# Patient Record
Sex: Female | Born: 1995 | Race: White | Hispanic: No | Marital: Married | Smoking: Never smoker
Health system: Southern US, Community
[De-identification: ages and names within clinical notes are randomized; demographics above are authoritative.]

---

## 2018-04-25 ENCOUNTER — Emergency Department (INDEPENDENT_AMBULATORY_CARE_PROVIDER_SITE_OTHER): Payer: No Typology Code available for payment source

## 2018-04-25 ENCOUNTER — Emergency Department
Admission: EM | Admit: 2018-04-25 | Discharge: 2018-04-25 | Disposition: A | Discharge status: Home / Self Care | Payer: No Typology Code available for payment source | Source: Home / Self Care | Attending: Family Medicine | Admitting: Family Medicine

## 2018-04-25 ENCOUNTER — Other Ambulatory Visit: Payer: Self-pay

## 2018-04-25 DIAGNOSIS — S29011A Strain of muscle and tendon of front wall of thorax, initial encounter: Secondary | ICD-10-CM

## 2018-04-25 DIAGNOSIS — R0781 Pleurodynia: Secondary | ICD-10-CM

## 2018-04-25 DIAGNOSIS — S29012A Strain of muscle and tendon of back wall of thorax, initial encounter: Secondary | ICD-10-CM

## 2018-04-25 MED ORDER — HYDROCODONE-ACETAMINOPHEN 5-325 MG PO TABS: ORAL_TABLET | ORAL | 0 refills | Status: DC

## 2018-04-25 MED ORDER — CYCLOBENZAPRINE HCL 10 MG PO TABS: 10.0000 mg | ORAL_TABLET | Freq: Every day | ORAL | 0 refills | Status: DC

## 2018-04-25 MED ORDER — ONDANSETRON 4 MG PO TBDP: ORAL_TABLET | ORAL | 0 refills | Status: DC

## 2018-04-25 NOTE — Discharge Instructions (Addendum)
Apply ice pack for 20 to 30 minutes, 3 to 4 times daily  Continue until pain and swelling decrease.  May take Ibuprofen 200mg, 4 tabs every 8 hours with food.  Begin range of motion and stretching exercises as tolerated. °

## 2018-04-25 NOTE — ED Provider Notes (Addendum)
[facility]    CSN: [id number] Arrival date & time: 04/25/18  1728     History   Chief Complaint Chief Complaint  Patient presents with  . Back Pain    trouble sleeping and breathing at times    HPI Tamara Pearson is a 22 y.o. female.   Patient was bathing her 80 pound dog in an outdoor plastic pool two days ago when he suddenly tried to jump out.  While restraining him she felt some vague mild discomfort in her left lateral chest.  Yesterday she awoke with significantly worse pain in her left chest radiating to her left scapula.  She has pain with inspiration, chest movement, and supine on her left side.  The pain has not responded to heat or ibuprofen.  She denies shortness of breath.  The history is provided by the patient.  Chest Pain  Pain location:  L lateral chest Pain quality: aching   Pain radiates to:  Upper back Pain severity:  Moderate Onset quality:  Gradual Duration:  2 days Timing:  Constant Progression:  Worsening Chronicity:  New Context: breathing, lifting, movement, raising an arm and at rest   Relieved by:  Nothing Worsened by:  Coughing, deep breathing, movement and certain positions Ineffective treatments: heat and ibuprofen. Associated symptoms: back pain   Associated symptoms: no abdominal pain, no cough, no diaphoresis, no fever and no shortness of breath     History reviewed. No pertinent past medical history.  There are no active problems to display for this patient.   History reviewed. No pertinent surgical history.  OB History    Gravida  1   Para      Term      Preterm      AB      Living        SAB      TAB      Ectopic      Multiple      Live Births               Home Medications    Prior to Admission medications   Medication Sig Start Date End Date Taking? Authorizing Provider  cyclobenzaprine (FLEXERIL) 10 MG tablet Take 1 tablet (10 mg total) by mouth at bedtime. 04/25/18   Lattie Haw, MD  HYDROcodone-acetaminophen (NORCO/VICODIN) 5-325 MG tablet Take one by mouth at bedtime as needed for pain.  May repeat in 4 to 6 hours if needed. 04/25/18   Lattie Haw, MD    Family History Family History  Problem Relation Age of Onset  . Hypertension Father     Social History Social History   Tobacco Use  . Smoking status: Never Smoker  . Smokeless tobacco: Never Used  Substance Use Topics  . Alcohol use: Yes    Comment: occasional  . Drug use: Never     Allergies   Patient has no known allergies.   Review of Systems Review of Systems  Constitutional: Negative for chills, diaphoresis and fever.  HENT: Negative.   Eyes: Negative.   Respiratory: Positive for chest tightness. Negative for cough, shortness of breath, wheezing and stridor.   Cardiovascular: Positive for chest pain.  Gastrointestinal: Negative for abdominal pain.  Genitourinary: Negative.   Musculoskeletal: Positive for back pain. Negative for neck pain.  Skin: Negative.   Neurological: Negative for light-headedness.     Physical Exam Triage Vital Signs ED Triage Vitals  Enc Vitals Group  BP 04/25/18 1757 133/80     Pulse Rate 04/25/18 1757 64     Resp 04/25/18 1757 18     Temp 04/25/18 1757 98.3 F (36.8 C)     Temp Source 04/25/18 1757 Oral     SpO2 --      Weight 04/25/18 1758 123 lb 6.4 oz (56 kg)     Height 04/25/18 1758 5\' 2"  (1.575 m)     Head Circumference --      Peak Flow --      Pain Score 04/25/18 1758 6     Pain Loc --      Pain Edu? --      Excl. in GC? --    No data found.  Updated Vital Signs BP 133/80 (BP Location: Right Arm)   Pulse 64   Temp 98.3 F (36.8 C) (Oral)   Resp 18   Ht 5\' 2"  (1.575 m)   Wt 56 kg   LMP 12/19/2017 (Approximate) Comment: pt not currently pregnant following up with OB  Breastfeeding? Unknown   BMI 22.57 kg/m   Visual Acuity Right Eye Distance:   Left Eye Distance:   Bilateral Distance:    Right Eye Near:   Left Eye  Near:    Bilateral Near:     Physical Exam  Constitutional: She appears well-developed and well-nourished. No distress.  HENT:  Head: Normocephalic.  Mouth/Throat: Oropharynx is clear and moist.  Eyes: Pupils are equal, round, and reactive to light.  Neck: Normal range of motion.  Cardiovascular: Normal heart sounds.  Pulmonary/Chest: Breath sounds normal.      There is diffuse mild tenderness to palpation left chest as noted on diagram.   There is distinct tenderness over medial and inferior edges of left scapula.  Pain elicited by resisted abduction of left shoulder while palpating left rhomboid muscles.     Abdominal: There is no tenderness.  Musculoskeletal: She exhibits no edema.  Neurological: She is alert.  Skin: Skin is warm and dry.  Nursing note and vitals reviewed.    UC Treatments / Results  Labs (all labs ordered are listed, but only abnormal results are displayed) Labs Reviewed - No data to display  EKG None  Radiology Dg Ribs Unilateral W/chest Left  Result Date: 04/25/2018 CLINICAL DATA:  Left posterolateral rib pain after washing her dog 2 days ago. EXAM: LEFT RIBS AND CHEST - 3+ VIEW COMPARISON:  None. FINDINGS: No fracture or other bone lesions are seen involving the ribs. There is no evidence of pneumothorax or pleural effusion. Both lungs are clear. Heart size and mediastinal contours are within normal limits. IMPRESSION: Negative. Electronically Signed   By: Sherian Rein M.D.   On: 04/25/2018 18:50    Procedures Procedures (including critical care time)  Medications Ordered in UC Medications - No data to display  Initial Impression / Assessment and Plan / UC Course  I have reviewed the triage vital signs and the nursing notes.  Pertinent labs & imaging results that were available during my care of the patient were reviewed by me and considered in my medical decision making (see chart for details).    Begin Flexeril 10mg  and Lortab (Rx #10,  no refill) at bedtime prn. Controlled Substance Prescriptions I have consulted the [place] Controlled Substances Registry for this patient, and feel the risk/benefit ratio today is favorable for proceeding with this prescription for a controlled substance.   Followup with Dr. Rodney Langton or Dr. Clementeen Graham ([facility]) if not improving about two weeks.    Final Clinical Impressions(s) / UC Diagnoses   Final diagnoses:  Rhomboid muscle strain, initial encounter  Intercostal muscle strain, initial encounter     Discharge Instructions     Apply ice pack for 20 to 30 minutes, 3 to 4 times daily  Continue until pain and swelling decrease.  May take Ibuprofen 200mg , 4 tabs every 8 hours with food.   Begin range of motion and stretching exercises as tolerated.    ED Prescriptions    Medication Sig Dispense Auth. Provider   cyclobenzaprine (FLEXERIL) 10 MG tablet Take 1 tablet (10 mg total) by mouth at bedtime. 10 tablet Lattie Haw, MD   HYDROcodone-acetaminophen (NORCO/VICODIN) 5-325 MG tablet Take one by mouth at bedtime as needed for pain.  May repeat in 4 to 6 hours if needed. 10 tablet Lattie Haw, MD         Lattie Haw, MD 04/25/18 1921  Addendum:  Patient reports that she has had nausea in the past when taking hydrocodone and requests Rx for Zofran ODT. Rx written for Zofran ODT 4mg , #12   Lattie Haw, MD 04/25/18 1926

## 2018-04-25 NOTE — ED Triage Notes (Signed)
Pt presents today with c/o back left sided shooting pain 6/0-10 that radiates to Left ribcage and upright. Pt explained that she was bathing her 80 lbs lab in her backyard in a plastic pool when her dog attempted to get out and she tried to catch him. The incident happened on Sunday 11/3 since then, she has been having trouble sleeping and pain during inspiration at times. She has taken ibuprofen for pain with mild relief.

## 2018-04-25 NOTE — ED Notes (Signed)
PT made aware she cannot drive or operate machinery while taking vicodin or flexeril for pain. PT instructed to not take additional tylenol with vicodin. PT instructed to use either one of the pain meds tonight, not both.

## 2019-05-09 ENCOUNTER — Telehealth: Payer: No Typology Code available for payment source | Admitting: Family

## 2019-05-09 DIAGNOSIS — B002 Herpesviral gingivostomatitis and pharyngotonsillitis: Secondary | ICD-10-CM

## 2019-05-09 MED ORDER — ACYCLOVIR 800 MG PO TABS: 800.0000 mg | ORAL_TABLET | Freq: Two times a day (BID) | ORAL | 1 refills | Status: DC

## 2019-05-09 NOTE — Progress Notes (Signed)
We are sorry that you are not feeling well.  Here is how we plan to help!  Based on what you have shared with me it does look like you have a viral infection.    Most cold sores or fever blisters are small fluid filled blisters around the mouth caused by herpes simplex virus.  The most common strain of the virus causing cold sores is herpes simplex virus 1.  It can be spread by skin contact, sharing eating utensils, or even sharing towels.  Cold sores are contagious to other people until dry. (Approximately 5-7 days).  Wash your hands. You can spread the virus to your eyes through handling your contact lenses after touching the lesions.  Most people experience pain at the sight or tingling sensations in their lips that may begin before the ulcers erupt.  Herpes simplex is treatable but not curable.  It may lie dormant for a long time and then reappear due to stress or prolonged sun exposure.  Many patients have success in treating their cold sores with an over the counter topical called Abreva.  You may apply the cream up to 5 times daily (maximum 10 days) until healing occurs.  If you would like to use an oral antiviral medication to speed the healing of your cold sore, I have sent a prescription to your local pharmacy Acyclovir 800 mg take one by mouth twice a day for 7 days.   HOME CARE:   Wash your hands frequently.  Do not pick at or rub the sore.  Don't open the blisters.  Avoid kissing other people during this time.  Avoid sharing drinking glasses, eating utensils, or razors.  Do not handle contact lenses unless you have thoroughly washed your hands with soap and warm water!  Avoid oral sex during this time.  Herpes from sores on your mouth can spread to your partner's genital area.  Avoid contact with anyone who has eczema or a weakened immune system.  Cold sores are often triggered by exposure to intense sunlight, use a lip balm containing a sunscreen (SPF 30 or  higher).  GET HELP RIGHT AWAY IF:   Blisters look infected.  Blisters occur near or in the eye.  Symptoms last longer than 10 days.  Your symptoms become worse.  MAKE SURE YOU:   Understand these instructions.  Will watch your condition.  Will get help right away if you are not doing well or get worse.    Your e-visit answers were reviewed by a board certified advanced clinical practitioner to complete your personal care plan.  Depending upon the condition, your plan could have  Included both over the counter or prescription medications.    Please review your pharmacy choice.  Be sure that the pharmacy you have chosen is open so that you can pick up your prescription now.  If there is a problem you can message your provider in MyChart to have the prescription routed to another pharmacy.    Your safety is important to us.  If you have drug allergies check our prescription carefully.  For the next 24 hours you can use MyChart to ask questions about today's visit, request a non-urgent call back, or ask for a work or school excuse from your e-visit provider.  You will get an email in the next two days asking about your experience.  I hope that your e-visit has been valuable and will speed your recovery.   Approximately 5 minutes was spent documenting and   reviewing patient's chart.    

## 2019-11-28 ENCOUNTER — Telehealth: Payer: No Typology Code available for payment source | Admitting: Family

## 2019-11-28 DIAGNOSIS — B009 Herpesviral infection, unspecified: Secondary | ICD-10-CM

## 2019-11-28 DIAGNOSIS — B002 Herpesviral gingivostomatitis and pharyngotonsillitis: Secondary | ICD-10-CM

## 2019-11-28 MED ORDER — ACYCLOVIR 800 MG PO TABS: 800.0000 mg | ORAL_TABLET | Freq: Two times a day (BID) | ORAL | 0 refills | Status: DC

## 2019-11-28 NOTE — Progress Notes (Signed)
We are sorry that you are not feeling well.  Here is how we plan to help!  Based on what you have shared with me it does look like you have a viral infection.    Most cold sores or fever blisters are small fluid filled blisters around the mouth caused by herpes simplex virus.  The most common strain of the virus causing cold sores is herpes simplex virus 1.  It can be spread by skin contact, sharing eating utensils, or even sharing towels.  Cold sores are contagious to other people until dry. (Approximately 5-7 days).  Wash your hands. You can spread the virus to your eyes through handling your contact lenses after touching the lesions.  Most people experience pain at the sight or tingling sensations in their lips that may begin before the ulcers erupt.  Herpes simplex is treatable but not curable.  It may lie dormant for a long time and then reappear due to stress or prolonged sun exposure.  Many patients have success in treating their cold sores with an over the counter topical called Abreva.  You may apply the cream up to 5 times daily (maximum 10 days) until healing occurs.  If you would like to use an oral antiviral medication to speed the healing of your cold sore, I have sent a prescription to your local pharmacy Acyclovir 800 mg take one by mouth twice a day for 7 days.   HOME CARE:   Wash your hands frequently.  Do not pick at or rub the sore.  Don't open the blisters.  Avoid kissing other people during this time.  Avoid sharing drinking glasses, eating utensils, or razors.  Do not handle contact lenses unless you have thoroughly washed your hands with soap and warm water!  Avoid oral sex during this time.  Herpes from sores on your mouth can spread to your partner's genital area.  Avoid contact with anyone who has eczema or a weakened immune system.  Cold sores are often triggered by exposure to intense sunlight, use a lip balm containing a sunscreen (SPF 30 or  higher).  GET HELP RIGHT AWAY IF:   Blisters look infected.  Blisters occur near or in the eye.  Symptoms last longer than 10 days.  Your symptoms become worse.  MAKE SURE YOU:   Understand these instructions.  Will watch your condition.  Will get help right away if you are not doing well or get worse.    Your e-visit answers were reviewed by a board certified advanced clinical practitioner to complete your personal care plan.  Depending upon the condition, your plan could have  Included both over the counter or prescription medications.    Please review your pharmacy choice.  Be sure that the pharmacy you have chosen is open so that you can pick up your prescription now.  If there is a problem you can message your provider in MyChart to have the prescription routed to another pharmacy.    Your safety is important to [place].  If you have drug allergies check our prescription carefully.  For the next 24 hours you can use MyChart to ask questions about today's visit, request a non-urgent call back, or ask for a work or school excuse from your e-visit provider.  You will get an email in the next two days asking about your experience.  I hope that your e-visit has been valuable and will speed your recovery.   Approximately 5 minutes was spent documenting and  reviewing patient's chart.

## 2019-12-25 ENCOUNTER — Other Ambulatory Visit: Payer: Self-pay

## 2019-12-25 DIAGNOSIS — B002 Herpesviral gingivostomatitis and pharyngotonsillitis: Secondary | ICD-10-CM

## 2019-12-25 MED ORDER — ACYCLOVIR 800 MG PO TABS: 800.0000 mg | ORAL_TABLET | Freq: Two times a day (BID) | ORAL | 0 refills | Status: DC

## 2020-02-18 ENCOUNTER — Other Ambulatory Visit: Payer: Self-pay

## 2020-02-18 ENCOUNTER — Telehealth: Payer: Self-pay | Admitting: Nurse Practitioner

## 2020-02-18 DIAGNOSIS — B002 Herpesviral gingivostomatitis and pharyngotonsillitis: Secondary | ICD-10-CM

## 2020-02-18 MED ORDER — VALACYCLOVIR HCL 1 G PO TABS: ORAL_TABLET | ORAL | 0 refills | Status: DC

## 2020-02-18 NOTE — Progress Notes (Signed)
We are sorry that you are not feeling well.  Here is how we plan to help!  Based on what you have shared with me it does look like you have a viral infection.    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 Most cold sores or fever blisters are small fluid filled blisters around the mouth caused by herpes simplex virus.  The most common strain of the virus causing cold sores is herpes simplex virus 1.  It can be spread by skin contact, sharing eating utensils, or even sharing towels.  Cold sores are contagious to other people until dry. (Approximately 5-7 days).  Wash your hands. You can spread the virus to your eyes through handling your contact lenses after touching the lesions.  Most people experience pain at the sight or tingling sensations in their lips that may begin before the ulcers erupt.  Herpes simplex is treatable but not curable.  It may lie dormant for a long time and then reappear due to stress or prolonged sun exposure.  Many patients have success in treating their cold sores with an over the counter topical called Abreva.  You may apply the cream up to 5 times daily (maximum 10 days) until healing occurs.  If you would like to use an oral antiviral medication to speed the healing of your cold sore, I have sent a prescription to your local pharmacy Valacyclovir 2 gm take one by mouth twice a day for 1 day    HOME CARE:   Wash your hands frequently.  Do not pick at or rub the sore.  Don't open the blisters.  Avoid kissing other people during this time.  Avoid sharing drinking glasses, eating utensils, or razors.  Do not handle contact lenses unless you have thoroughly washed your hands with soap and warm water!  Avoid oral sex during this time.  Herpes from sores on your mouth can spread to your partner's genital area.  Avoid contact with anyone who has eczema or a weakened immune system.  Cold  sores are often triggered by exposure to intense sunlight, use a lip balm containing a sunscreen (SPF 30 or higher).  GET HELP RIGHT AWAY IF:   Blisters look infected.  Blisters occur near or in the eye.  Symptoms last longer than 10 days.  Your symptoms become worse.  MAKE SURE YOU:   Understand these instructions.  Will watch your condition.  Will get help right away if you are not doing well or get worse.    Your e-visit answers were reviewed by a board certified advanced clinical practitioner to complete your personal care plan.  Depending upon the condition, your plan could have  Included both over the counter or prescription medications.    Please review your pharmacy choice.  Be sure that the pharmacy you have chosen is open so that you can pick up your prescription now.  If there is a problem you can message your provider in MyChart to have the prescription routed to another pharmacy.    Your safety is important to [place].  If you have drug allergies check our prescription carefully.  For the next 24 hours you can use MyChart to ask questions about today's visit, request a non-urgent call back, or ask for a work or school excuse from your e-visit provider.  You will get an email in the next two days asking about your experience.  I hope that your e-visit has been valuable and will speed your recovery.  There are other unrelated non-urgent complaints, but due to the busy schedule and the amount of time I've already spent with her, time does not permit me to address these routine issues at today's visit. I've requested another appointment to review these additional issues.  5-10 minutes spent reviewing and documenting in chart.

## 2020-05-14 ENCOUNTER — Other Ambulatory Visit: Payer: Self-pay | Admitting: Obstetrics & Gynecology

## 2020-05-14 DIAGNOSIS — R319 Hematuria, unspecified: Secondary | ICD-10-CM

## 2020-06-03 ENCOUNTER — Ambulatory Visit
Admission: RE | Admit: 2020-06-03 | Discharge: 2020-06-03 | Disposition: A | Discharge status: Home / Self Care | Payer: Self-pay | Source: Ambulatory Visit | Attending: Obstetrics & Gynecology | Admitting: Obstetrics & Gynecology

## 2020-06-03 DIAGNOSIS — R319 Hematuria, unspecified: Secondary | ICD-10-CM

## 2020-10-15 LAB — OB RESULTS CONSOLE GBS: GBS: NEGATIVE

## 2020-11-07 ENCOUNTER — Inpatient Hospital Stay (HOSPITAL_COMMUNITY): Admit: 2020-11-07 | Payer: Self-pay

## 2020-11-13 ENCOUNTER — Other Ambulatory Visit: Payer: Self-pay | Admitting: Obstetrics & Gynecology

## 2020-11-14 ENCOUNTER — Other Ambulatory Visit: Payer: Self-pay

## 2020-11-15 ENCOUNTER — Inpatient Hospital Stay (HOSPITAL_COMMUNITY): Payer: Managed Care, Other (non HMO) | Admitting: Anesthesiology

## 2020-11-15 ENCOUNTER — Encounter (HOSPITAL_COMMUNITY): Payer: Self-pay | Admitting: Obstetrics & Gynecology

## 2020-11-15 ENCOUNTER — Inpatient Hospital Stay (HOSPITAL_COMMUNITY): Payer: Managed Care, Other (non HMO)

## 2020-11-15 ENCOUNTER — Inpatient Hospital Stay (HOSPITAL_COMMUNITY)
Admission: AD | Admit: 2020-11-15 | Discharge: 2020-11-18 | DRG: 786 | Disposition: A | Discharge status: Home / Self Care | Payer: Managed Care, Other (non HMO) | Attending: Obstetrics & Gynecology | Admitting: Obstetrics & Gynecology

## 2020-11-15 DIAGNOSIS — O411230 Chorioamnionitis, third trimester, not applicable or unspecified: Secondary | ICD-10-CM | POA: Diagnosis present

## 2020-11-15 DIAGNOSIS — O480 Post-term pregnancy: Principal | ICD-10-CM | POA: Diagnosis present

## 2020-11-15 DIAGNOSIS — D62 Acute posthemorrhagic anemia: Secondary | ICD-10-CM | POA: Diagnosis not present

## 2020-11-15 DIAGNOSIS — O9912 Other diseases of the blood and blood-forming organs and certain disorders involving the immune mechanism complicating childbirth: Secondary | ICD-10-CM | POA: Diagnosis present

## 2020-11-15 DIAGNOSIS — Z20822 Contact with and (suspected) exposure to covid-19: Secondary | ICD-10-CM | POA: Diagnosis present

## 2020-11-15 DIAGNOSIS — O9081 Anemia of the puerperium: Secondary | ICD-10-CM | POA: Diagnosis not present

## 2020-11-15 DIAGNOSIS — D6959 Other secondary thrombocytopenia: Secondary | ICD-10-CM | POA: Diagnosis present

## 2020-11-15 DIAGNOSIS — O358XX0 Maternal care for other (suspected) fetal abnormality and damage, not applicable or unspecified: Secondary | ICD-10-CM | POA: Diagnosis present

## 2020-11-15 DIAGNOSIS — O411290 Chorioamnionitis, unspecified trimester, not applicable or unspecified: Secondary | ICD-10-CM | POA: Diagnosis not present

## 2020-11-15 DIAGNOSIS — D696 Thrombocytopenia, unspecified: Secondary | ICD-10-CM | POA: Diagnosis present

## 2020-11-15 DIAGNOSIS — O339 Maternal care for disproportion, unspecified: Secondary | ICD-10-CM | POA: Diagnosis present

## 2020-11-15 DIAGNOSIS — O26893 Other specified pregnancy related conditions, third trimester: Secondary | ICD-10-CM | POA: Diagnosis present

## 2020-11-15 DIAGNOSIS — Z3490 Encounter for supervision of normal pregnancy, unspecified, unspecified trimester: Secondary | ICD-10-CM | POA: Diagnosis present

## 2020-11-15 DIAGNOSIS — O99119 Other diseases of the blood and blood-forming organs and certain disorders involving the immune mechanism complicating pregnancy, unspecified trimester: Secondary | ICD-10-CM | POA: Diagnosis present

## 2020-11-15 DIAGNOSIS — Z3A41 41 weeks gestation of pregnancy: Secondary | ICD-10-CM

## 2020-11-15 DIAGNOSIS — O99892 Other specified diseases and conditions complicating childbirth: Secondary | ICD-10-CM | POA: Diagnosis present

## 2020-11-15 DIAGNOSIS — R319 Hematuria, unspecified: Secondary | ICD-10-CM | POA: Diagnosis present

## 2020-11-15 LAB — CBC
HCT: 37.5 % (ref 36.0–46.0)
Hemoglobin: 12.9 g/dL (ref 12.0–15.0)
MCH: 31.9 pg (ref 26.0–34.0)
MCHC: 34.4 g/dL (ref 30.0–36.0)
MCV: 92.8 fL (ref 80.0–100.0)
Platelets: 127 10*3/uL — ABNORMAL LOW (ref 150–400)
RBC: 4.04 MIL/uL (ref 3.87–5.11)
RDW: 13.3 % (ref 11.5–15.5)
WBC: 11.4 10*3/uL — ABNORMAL HIGH (ref 4.0–10.5)
nRBC: 0 % (ref 0.0–0.2)

## 2020-11-15 LAB — RPR: RPR Ser Ql: NONREACTIVE

## 2020-11-15 LAB — RESP PANEL BY RT-PCR (FLU A&B, COVID) ARPGX2
Influenza A by PCR: NEGATIVE
Influenza B by PCR: NEGATIVE
SARS Coronavirus 2 by RT PCR: NEGATIVE

## 2020-11-15 LAB — TYPE AND SCREEN
ABO/RH(D): O POS
Antibody Screen: NEGATIVE

## 2020-11-15 MED ORDER — OXYTOCIN-SODIUM CHLORIDE 30-0.9 UT/500ML-% IV SOLN
2.5000 [IU]/h | INTRAVENOUS | Status: DC
  Filled 2020-11-15: qty 500

## 2020-11-15 MED ORDER — EPHEDRINE 5 MG/ML INJ: 10.0000 mg | INTRAVENOUS | Status: DC | PRN

## 2020-11-15 MED ORDER — MISOPROSTOL 25 MCG QUARTER TABLET
25.0000 ug | ORAL_TABLET | ORAL | Status: DC | PRN
  Administered 2020-11-15 (×2): 25 ug via VAGINAL
  Filled 2020-11-15 (×2): qty 1

## 2020-11-15 MED ORDER — FENTANYL-BUPIVACAINE-NACL 0.5-0.125-0.9 MG/250ML-% EP SOLN
12.0000 mL/h | EPIDURAL | Status: DC | PRN
  Administered 2020-11-15 – 2020-11-16 (×2): 12 mL/h via EPIDURAL
  Filled 2020-11-15 (×2): qty 250

## 2020-11-15 MED ORDER — LACTATED RINGERS IV SOLN
INTRAVENOUS | Status: DC
  Administered 2020-11-15: 125 mL/h via INTRAVENOUS

## 2020-11-15 MED ORDER — OXYTOCIN-SODIUM CHLORIDE 30-0.9 UT/500ML-% IV SOLN
1.0000 m[IU]/min | INTRAVENOUS | Status: DC
  Administered 2020-11-15: 1 m[IU]/min via INTRAVENOUS

## 2020-11-15 MED ORDER — OXYTOCIN BOLUS FROM INFUSION: 333.0000 mL | Freq: Once | INTRAVENOUS | Status: DC

## 2020-11-15 MED ORDER — METOCLOPRAMIDE HCL 5 MG/ML IJ SOLN
10.0000 mg | Freq: Four times a day (QID) | INTRAMUSCULAR | Status: DC
  Administered 2020-11-15: 10 mg via INTRAVENOUS
  Administered 2020-11-16 (×2): 5 mg via INTRAVENOUS
  Filled 2020-11-15: qty 2

## 2020-11-15 MED ORDER — DIPHENHYDRAMINE HCL 50 MG/ML IJ SOLN
12.5000 mg | INTRAMUSCULAR | Status: DC | PRN
Start: 2020-11-15 — End: 2020-11-16

## 2020-11-15 MED ORDER — LACTATED RINGERS IV SOLN
500.0000 mL | Freq: Once | INTRAVENOUS | Status: AC
  Administered 2020-11-15: 500 mL via INTRAVENOUS

## 2020-11-15 MED ORDER — TERBUTALINE SULFATE 1 MG/ML IJ SOLN
0.2500 mg | Freq: Once | INTRAMUSCULAR | Status: DC | PRN
Start: 2020-11-15 — End: 2020-11-16

## 2020-11-15 MED ORDER — ONDANSETRON HCL 4 MG/2ML IJ SOLN
4.0000 mg | Freq: Four times a day (QID) | INTRAMUSCULAR | Status: DC | PRN
  Administered 2020-11-15 – 2020-11-16 (×3): 4 mg via INTRAVENOUS
  Filled 2020-11-15 (×2): qty 2

## 2020-11-15 MED ORDER — PHENYLEPHRINE 40 MCG/ML (10ML) SYRINGE FOR IV PUSH (FOR BLOOD PRESSURE SUPPORT): 80.0000 ug | PREFILLED_SYRINGE | INTRAVENOUS | Status: DC | PRN

## 2020-11-15 MED ORDER — LIDOCAINE HCL (PF) 1 % IJ SOLN
30.0000 mL | INTRAMUSCULAR | Status: DC | PRN
Start: 2020-11-15 — End: 2020-11-16

## 2020-11-15 MED ORDER — LACTATED RINGERS IV SOLN
500.0000 mL | INTRAVENOUS | Status: DC | PRN
  Administered 2020-11-15: 500 mL via INTRAVENOUS

## 2020-11-15 MED ORDER — ACETAMINOPHEN 325 MG PO TABS: 650.0000 mg | ORAL_TABLET | ORAL | Status: DC | PRN

## 2020-11-15 MED ORDER — SOD CITRATE-CITRIC ACID 500-334 MG/5ML PO SOLN
30.0000 mL | ORAL | Status: DC | PRN
  Administered 2020-11-16: 30 mL via ORAL
  Filled 2020-11-15: qty 15

## 2020-11-15 MED ORDER — LIDOCAINE-EPINEPHRINE (PF) 2 %-1:200000 IJ SOLN
INTRAMUSCULAR | Status: DC | PRN
  Administered 2020-11-15: 4 mL via EPIDURAL

## 2020-11-15 MED ORDER — FENTANYL CITRATE (PF) 100 MCG/2ML IJ SOLN
50.0000 ug | INTRAMUSCULAR | Status: DC | PRN
Start: 2020-11-15 — End: 2020-11-16
  Administered 2020-11-15 (×4): 100 ug via INTRAVENOUS
  Filled 2020-11-15 (×4): qty 2

## 2020-11-15 NOTE — Anesthesia Preprocedure Evaluation (Signed)
Anesthesia Evaluation  Patient identified by MRN, date of birth, ID band Patient awake    Reviewed: Allergy & Precautions, NPO status , Patient's Chart, lab work & pertinent test results  Airway Mallampati: II  TM Distance: >3 FB Neck ROM: Full    Dental no notable dental hx.    Pulmonary neg pulmonary ROS,    Pulmonary exam normal breath sounds clear to auscultation       Cardiovascular negative cardio ROS Normal cardiovascular exam Rhythm:Regular Rate:Normal     Neuro/Psych negative neurological ROS  negative psych ROS   GI/Hepatic negative GI ROS, Neg liver ROS,   Endo/Other  negative endocrine ROS  Renal/GU negative Renal ROS  negative genitourinary   Musculoskeletal negative musculoskeletal ROS (+)   Abdominal   Peds  Hematology plt 127   Anesthesia Other Findings   Reproductive/Obstetrics (+) Pregnancy                             Anesthesia Physical Anesthesia Plan  ASA: II  Anesthesia Plan: Epidural   Post-op Pain Management:    Induction:   PONV Risk Score and Plan: Treatment may vary due to age or medical condition  Airway Management Planned: Natural Airway  Additional Equipment:   Intra-op Plan:   Post-operative Plan:   Informed Consent: I have reviewed the patients History and Physical, chart, labs and discussed the procedure including the risks, benefits and alternatives for the proposed anesthesia with the patient or authorized representative who has indicated his/her understanding and acceptance.       Plan Discussed with: Anesthesiologist  Anesthesia Plan Comments: (Patient identified. Risks, benefits, options discussed with patient including but not limited to bleeding, infection, nerve damage, paralysis, failed block, incomplete pain control, headache, blood pressure changes, nausea, vomiting, reactions to medication, itching, and post partum back pain.  Confirmed with bedside nurse the patient's most recent platelet count. Confirmed with the patient that they are not taking any anticoagulation, have any bleeding history or any family history of bleeding disorders. Patient expressed understanding and wishes to proceed. All questions were answered. )        Anesthesia Quick Evaluation

## 2020-11-15 NOTE — Progress Notes (Signed)
Spoke with RN earlier and reviewed FHT,she was just post epidural FHT noted few variable decels and some with late position but mod variability -  S/p 2 Cytotec doses, now pitocin was at 3 mu rate, adsvised RN to bring down to 1 mu rate and reassess if better if UCs spaced FHT now cat I and UCs q 2 min.  No change in Cx per RN.continue to follow

## 2020-11-15 NOTE — Progress Notes (Signed)
Tamara Pearson is a 25 y.o. G1P0 at [redacted]w[redacted]d by ultrasound admitted for induction of labor due to Post dates, 41.1 wks.  Subjective: Vomiting, Zofran x 3 doses didn't help, Reglan not helping Pain controlled, getting tired   Objective: BP 131/67   Pulse 84   Temp 98.4 F (36.9 C) (Oral)   Resp 18   Ht 5\' 2"  (1.575 m)   Wt 74 kg   LMP 02/01/2020 (Exact Date)   SpO2 99%   BMI 29.85 kg/m  I/O last 3 completed shifts: In: 187.5 [I.V.:187.5] Out: 100 [Urine:100] No intake/output data recorded.  FHT:  FHR: 150 bpm, variability: moderate,  accelerations:  Present,  decelerations:  Present mild decels in 110s with UCs and quick return to baseline   Cat I  UC:   regular, every 3 minutes SVE:   Dilation: 3 Effacement (%): 100 Station: -2 Exam by:: Waylynn Benefiel, MD Head well applied to cervix, very small caput noted.  Urine clear, no hematuria  LE swelling since given several fluid boluses by AM RN. Advised to check with MD now before boluses    Assessment / Plan: IOL, protracted latent phase. S/p 2 Cytotecs and we started very low dose pitocin due to UCs and is not back down to 1 mu rate  MOD working towards SVD, continue to assess, hoping for her to enter active labor soon Right lateral with peanut ball   [phone] 11/15/2020, 8:31 PM

## 2020-11-15 NOTE — Anesthesia Procedure Notes (Signed)
Epidural Patient location during procedure: OB Start time: 11/15/2020 12:10 PM End time: 11/15/2020 12:20 PM  Staffing Anesthesiologist: Elmer Picker, MD Performed: anesthesiologist   Preanesthetic Checklist Completed: patient identified, IV checked, risks and benefits discussed, monitors and equipment checked, pre-op evaluation and timeout performed  Epidural Patient position: sitting Prep: DuraPrep and site prepped and draped Patient monitoring: continuous pulse ox, blood pressure, heart rate and cardiac monitor Approach: midline Location: L3-L4 Injection technique: LOR air  Needle:  Needle type: Tuohy  Needle gauge: 17 G Needle length: 9 cm Needle insertion depth: 4 cm Catheter type: closed end flexible Catheter size: 19 Gauge Catheter at skin depth: 10 cm Test dose: negative  Assessment Sensory level: T8 Events: blood not aspirated, injection not painful, no injection resistance, no paresthesia and negative IV test  Additional Notes Patient identified. Risks/Benefits/Options discussed with patient including but not limited to bleeding, infection, nerve damage, paralysis, failed block, incomplete pain control, headache, blood pressure changes, nausea, vomiting, reactions to medication both or allergic, itching and postpartum back pain. Confirmed with bedside nurse the patient's most recent platelet count. Confirmed with patient that they are not currently taking any anticoagulation, have any bleeding history or any family history of bleeding disorders. Patient expressed understanding and wished to proceed. All questions were answered. Sterile technique was used throughout the entire procedure. Please see nursing notes for vital signs. Test dose was given through epidural catheter and negative prior to continuing to dose epidural or start infusion. Warning signs of high block given to the patient including shortness of breath, tingling/numbness in hands, complete motor block,  or any concerning symptoms with instructions to call for help. Patient was given instructions on fall risk and not to get out of bed. All questions and concerns addressed with instructions to call with any issues or inadequate analgesia.  Reason for block:procedure for pain

## 2020-11-15 NOTE — H&P (Signed)
Tamara Pearson is a 25 y.o. female G1 presenting for IOL at 41.1 wks. Uncomplicated pregnancy but 44 lb weight gain. Bilateral choroid plexus cysts, no other markers. Growth at 34.5 wks- 5'13" 58% AC 58%,Nll AFI.   OB History    Gravida  1   Para      Term      Preterm      AB      Living        SAB      IAB      Ectopic      Multiple      Live Births             History reviewed. No pertinent past medical history. History reviewed. No pertinent surgical history. Family History: family history includes Hypertension in her father. Social History:  reports that she has never smoked. She has never used smokeless tobacco. She reports previous alcohol use. She reports that she does not use drugs.     Maternal Diabetes: No Genetic Screening: declined Down's screen.AFP1 neg  Maternal Ultrasounds/Referrals: Isolated choroid plexus cyst bilateral, no other markers  Fetal Ultrasounds or other Referrals:  None Maternal Substance Abuse:  No Significant Maternal Medications:  None Significant Maternal Lab Results:  Group B Strep negative Other Comments:  None  Review of Systems History Dilation: 1 Effacement (%): 100 Station: -2 Exam by:: Dr. [name] Blood pressure 126/81, pulse 98, temperature 98.3 F (36.8 C), temperature source Oral, resp. rate 16, height 5\' 2"  (1.575 m), weight 74 kg, last menstrual period 02/01/2020, SpO2 100 %, unknown if currently breastfeeding. Exam Physical Exam  A&O x 3, no acute distress. Pleasant HEENT neg, no thyromegaly Lungs CTA bilat CV RRR, S1S2 normal Abdo soft, non tender, non acute Extr no edema/ tenderness Pelvic 1/100%/-2 Vx Borderline pelvis FHT 140s mod variability, + accels no decels, cat I Toco q 3 min  Prenatal labs: ABO, Rh: --/--/O POS (05/28 0033) Antibody: NEG (05/28 0033) Rubella:  Immune  RPR:   NR HBsAg:   Neg HIV:   Neg GBS: Negative/-- (04/27 0000)  AFP1 neg Glucola nl  Assessment/Plan: 25 yo G1 at  41.1 wks. Here for IOL. S/p Cytotec x 2 and contracting well,add low dose pitocin if spaces out. FHT cat I EFW 8 lbs. Borderline pelvis. Assess progress closely in active labor  Pt has already taken 3 doses of Fentanyl since admission last night, plan early epidural when ready   34 11/15/2020, 10:47 AM

## 2020-11-16 ENCOUNTER — Encounter (HOSPITAL_COMMUNITY): Payer: Self-pay | Admitting: Obstetrics & Gynecology

## 2020-11-16 ENCOUNTER — Encounter (HOSPITAL_COMMUNITY)
Admission: AD | Disposition: A | Discharge status: Home / Self Care | Payer: Self-pay | Source: Home / Self Care | Attending: Obstetrics & Gynecology

## 2020-11-16 DIAGNOSIS — O99892 Other specified diseases and conditions complicating childbirth: Secondary | ICD-10-CM | POA: Diagnosis present

## 2020-11-16 SURGERY — Surgical Case
Anesthesia: Epidural

## 2020-11-16 MED ORDER — NALBUPHINE HCL 10 MG/ML IJ SOLN: 5.0000 mg | Freq: Once | INTRAMUSCULAR | Status: DC | PRN

## 2020-11-16 MED ORDER — SODIUM CHLORIDE 0.9 % IV SOLN
2.0000 g | Freq: Four times a day (QID) | INTRAVENOUS | Status: DC
  Administered 2020-11-16: 2 g via INTRAVENOUS
  Filled 2020-11-16 (×5): qty 2

## 2020-11-16 MED ORDER — DIPHENHYDRAMINE HCL 50 MG/ML IJ SOLN: 12.5000 mg | INTRAMUSCULAR | Status: DC | PRN

## 2020-11-16 MED ORDER — ONDANSETRON HCL 4 MG/2ML IJ SOLN: 4.0000 mg | Freq: Once | INTRAMUSCULAR | Status: DC | PRN

## 2020-11-16 MED ORDER — MEPERIDINE HCL 25 MG/ML IJ SOLN: 6.2500 mg | INTRAMUSCULAR | Status: DC | PRN

## 2020-11-16 MED ORDER — KETOROLAC TROMETHAMINE 30 MG/ML IJ SOLN
INTRAMUSCULAR | Status: AC
  Filled 2020-11-16: qty 1

## 2020-11-16 MED ORDER — CEFOXITIN SODIUM 2 G IV SOLR
2.0000 g | Freq: Four times a day (QID) | INTRAVENOUS | Status: AC
  Administered 2020-11-16 – 2020-11-17 (×4): 2 g via INTRAVENOUS
  Filled 2020-11-16 (×5): qty 2

## 2020-11-16 MED ORDER — SODIUM CHLORIDE 0.9 % IV SOLN
INTRAVENOUS | Status: AC
  Filled 2020-11-16: qty 500

## 2020-11-16 MED ORDER — OXYTOCIN-SODIUM CHLORIDE 30-0.9 UT/500ML-% IV SOLN
INTRAVENOUS | Status: AC
  Filled 2020-11-16: qty 500

## 2020-11-16 MED ORDER — NALBUPHINE HCL 10 MG/ML IJ SOLN: 5.0000 mg | INTRAMUSCULAR | Status: DC | PRN

## 2020-11-16 MED ORDER — METOCLOPRAMIDE HCL 5 MG/ML IJ SOLN
INTRAMUSCULAR | Status: AC
  Filled 2020-11-16: qty 2

## 2020-11-16 MED ORDER — SIMETHICONE 80 MG PO CHEW: 80.0000 mg | CHEWABLE_TABLET | ORAL | Status: DC | PRN

## 2020-11-16 MED ORDER — METHYLERGONOVINE MALEATE 0.2 MG/ML IJ SOLN
INTRAMUSCULAR | Status: AC
  Filled 2020-11-16: qty 1

## 2020-11-16 MED ORDER — COCONUT OIL OIL: 1.0000 "application " | TOPICAL_OIL | Status: DC | PRN

## 2020-11-16 MED ORDER — ACETAMINOPHEN 325 MG PO TABS: 325.0000 mg | ORAL_TABLET | ORAL | Status: DC | PRN

## 2020-11-16 MED ORDER — ACETAMINOPHEN 160 MG/5ML PO SOLN: 325.0000 mg | ORAL | Status: DC | PRN

## 2020-11-16 MED ORDER — NALOXONE HCL 0.4 MG/ML IJ SOLN: 0.4000 mg | INTRAMUSCULAR | Status: DC | PRN

## 2020-11-16 MED ORDER — KETOROLAC TROMETHAMINE 30 MG/ML IJ SOLN: 30.0000 mg | Freq: Four times a day (QID) | INTRAMUSCULAR | Status: DC | PRN

## 2020-11-16 MED ORDER — SODIUM CHLORIDE 0.9% FLUSH: 3.0000 mL | INTRAVENOUS | Status: DC | PRN

## 2020-11-16 MED ORDER — FENTANYL CITRATE (PF) 100 MCG/2ML IJ SOLN: 25.0000 ug | INTRAMUSCULAR | Status: DC | PRN

## 2020-11-16 MED ORDER — MORPHINE SULFATE (PF) 0.5 MG/ML IJ SOLN
INTRAMUSCULAR | Status: DC | PRN
  Administered 2020-11-16: 3 mg via EPIDURAL

## 2020-11-16 MED ORDER — ONDANSETRON HCL 4 MG/2ML IJ SOLN
4.0000 mg | Freq: Three times a day (TID) | INTRAMUSCULAR | Status: DC | PRN
Start: 2020-11-16 — End: 2020-11-18

## 2020-11-16 MED ORDER — OXYTOCIN-SODIUM CHLORIDE 30-0.9 UT/500ML-% IV SOLN
2.5000 [IU]/h | INTRAVENOUS | Status: AC
  Administered 2020-11-16: 2.5 [IU]/h via INTRAVENOUS
  Filled 2020-11-16: qty 500

## 2020-11-16 MED ORDER — DEXAMETHASONE SODIUM PHOSPHATE 4 MG/ML IJ SOLN
INTRAMUSCULAR | Status: AC
  Filled 2020-11-16: qty 1

## 2020-11-16 MED ORDER — TETANUS-DIPHTH-ACELL PERTUSSIS 5-2.5-18.5 LF-MCG/0.5 IM SUSY: 0.5000 mL | PREFILLED_SYRINGE | Freq: Once | INTRAMUSCULAR | Status: DC

## 2020-11-16 MED ORDER — IBUPROFEN 600 MG PO TABS
600.0000 mg | ORAL_TABLET | Freq: Four times a day (QID) | ORAL | Status: DC
  Administered 2020-11-17 – 2020-11-18 (×4): 600 mg via ORAL
  Filled 2020-11-16 (×4): qty 1

## 2020-11-16 MED ORDER — PROPOFOL 10 MG/ML IV BOLUS
INTRAVENOUS | Status: AC
  Filled 2020-11-16: qty 20

## 2020-11-16 MED ORDER — SODIUM BICARBONATE 8.4 % IV SOLN
INTRAVENOUS | Status: DC | PRN
  Administered 2020-11-16: 10 mL via EPIDURAL

## 2020-11-16 MED ORDER — ACETAMINOPHEN 500 MG PO TABS
1000.0000 mg | ORAL_TABLET | Freq: Once | ORAL | Status: AC
  Administered 2020-11-16: 1000 mg via ORAL
  Filled 2020-11-16: qty 2

## 2020-11-16 MED ORDER — SODIUM CHLORIDE 0.9 % IV SOLN
500.0000 mg | INTRAVENOUS | Status: AC
  Administered 2020-11-16: 500 mg via INTRAVENOUS

## 2020-11-16 MED ORDER — OXYCODONE HCL 5 MG PO TABS
5.0000 mg | ORAL_TABLET | Freq: Once | ORAL | Status: DC | PRN
Start: 2020-11-16 — End: 2020-11-16

## 2020-11-16 MED ORDER — SCOPOLAMINE 1 MG/3DAYS TD PT72: 1.0000 | MEDICATED_PATCH | Freq: Once | TRANSDERMAL | Status: DC

## 2020-11-16 MED ORDER — SODIUM CHLORIDE 0.9 % IR SOLN: Status: DC | PRN

## 2020-11-16 MED ORDER — ONDANSETRON HCL 4 MG/2ML IJ SOLN
INTRAMUSCULAR | Status: AC
  Filled 2020-11-16: qty 2

## 2020-11-16 MED ORDER — OXYTOCIN-SODIUM CHLORIDE 30-0.9 UT/500ML-% IV SOLN
INTRAVENOUS | Status: DC | PRN
  Administered 2020-11-16: 300 mL via INTRAVENOUS

## 2020-11-16 MED ORDER — ACETAMINOPHEN 325 MG PO TABS
650.0000 mg | ORAL_TABLET | ORAL | Status: DC | PRN
  Administered 2020-11-16 – 2020-11-17 (×3): 650 mg via ORAL
  Filled 2020-11-16 (×4): qty 2

## 2020-11-16 MED ORDER — PROPOFOL 10 MG/ML IV BOLUS
INTRAVENOUS | Status: DC | PRN
  Administered 2020-11-16 (×2): 10 mg via INTRAVENOUS

## 2020-11-16 MED ORDER — FENTANYL CITRATE (PF) 100 MCG/2ML IJ SOLN
INTRAMUSCULAR | Status: AC
  Filled 2020-11-16: qty 2

## 2020-11-16 MED ORDER — PHENYLEPHRINE HCL (PRESSORS) 10 MG/ML IV SOLN
INTRAVENOUS | Status: DC | PRN
  Administered 2020-11-16: 40 ug via INTRAVENOUS

## 2020-11-16 MED ORDER — PRENATAL MULTIVITAMIN CH
1.0000 | ORAL_TABLET | Freq: Every day | ORAL | Status: DC
  Administered 2020-11-17 – 2020-11-18 (×2): 1 via ORAL
  Filled 2020-11-16 (×2): qty 1

## 2020-11-16 MED ORDER — DIPHENHYDRAMINE HCL 25 MG PO CAPS: 25.0000 mg | ORAL_CAPSULE | Freq: Four times a day (QID) | ORAL | Status: DC | PRN

## 2020-11-16 MED ORDER — KETOROLAC TROMETHAMINE 30 MG/ML IJ SOLN
30.0000 mg | Freq: Four times a day (QID) | INTRAMUSCULAR | Status: AC
  Administered 2020-11-16 – 2020-11-17 (×4): 30 mg via INTRAVENOUS
  Filled 2020-11-16 (×4): qty 1

## 2020-11-16 MED ORDER — DIPHENHYDRAMINE HCL 25 MG PO CAPS: 25.0000 mg | ORAL_CAPSULE | ORAL | Status: DC | PRN

## 2020-11-16 MED ORDER — DIBUCAINE (PERIANAL) 1 % EX OINT: 1.0000 "application " | TOPICAL_OINTMENT | CUTANEOUS | Status: DC | PRN

## 2020-11-16 MED ORDER — MENTHOL 3 MG MT LOZG: 1.0000 | LOZENGE | OROMUCOSAL | Status: DC | PRN

## 2020-11-16 MED ORDER — OXYCODONE HCL 5 MG/5ML PO SOLN: 5.0000 mg | Freq: Once | ORAL | Status: DC | PRN

## 2020-11-16 MED ORDER — OXYCODONE HCL 5 MG PO TABS
5.0000 mg | ORAL_TABLET | ORAL | Status: DC | PRN
  Administered 2020-11-16 – 2020-11-18 (×7): 5 mg via ORAL
  Filled 2020-11-16 (×7): qty 1

## 2020-11-16 MED ORDER — SIMETHICONE 80 MG PO CHEW
80.0000 mg | CHEWABLE_TABLET | Freq: Three times a day (TID) | ORAL | Status: DC
  Administered 2020-11-16 – 2020-11-18 (×6): 80 mg via ORAL
  Filled 2020-11-16 (×6): qty 1

## 2020-11-16 MED ORDER — LACTATED RINGERS IV SOLN: INTRAVENOUS | Status: DC | PRN

## 2020-11-16 MED ORDER — MORPHINE SULFATE (PF) 0.5 MG/ML IJ SOLN
INTRAMUSCULAR | Status: AC
  Filled 2020-11-16: qty 10

## 2020-11-16 MED ORDER — POVIDONE-IODINE 10 % EX SWAB: 2.0000 "application " | Freq: Once | CUTANEOUS | Status: DC

## 2020-11-16 MED ORDER — METHYLERGONOVINE MALEATE 0.2 MG/ML IJ SOLN
INTRAMUSCULAR | Status: DC | PRN
  Administered 2020-11-16: .2 mg via INTRAMUSCULAR

## 2020-11-16 MED ORDER — SENNOSIDES-DOCUSATE SODIUM 8.6-50 MG PO TABS
2.0000 | ORAL_TABLET | Freq: Every day | ORAL | Status: DC
  Administered 2020-11-17 – 2020-11-18 (×2): 2 via ORAL
  Filled 2020-11-16 (×2): qty 2

## 2020-11-16 MED ORDER — NALOXONE HCL 4 MG/10ML IJ SOLN
1.0000 ug/kg/h | INTRAVENOUS | Status: DC | PRN
  Filled 2020-11-16: qty 5

## 2020-11-16 MED ORDER — SODIUM CHLORIDE 0.9 % IV SOLN
2.0000 g | INTRAVENOUS | Status: AC
  Administered 2020-11-16: 2 g via INTRAVENOUS
  Filled 2020-11-16: qty 2

## 2020-11-16 MED ORDER — WITCH HAZEL-GLYCERIN EX PADS: 1.0000 "application " | MEDICATED_PAD | CUTANEOUS | Status: DC | PRN

## 2020-11-16 MED ORDER — ZOLPIDEM TARTRATE 5 MG PO TABS: 5.0000 mg | ORAL_TABLET | Freq: Every evening | ORAL | Status: DC | PRN

## 2020-11-16 SURGICAL SUPPLY — 37 items
BARRIER ADHS 3X4 INTERCEED (GAUZE/BANDAGES/DRESSINGS) ×2 IMPLANT
BENZOIN TINCTURE PRP APPL 2/3 (GAUZE/BANDAGES/DRESSINGS) ×2 IMPLANT
CHLORAPREP W/TINT 26ML (MISCELLANEOUS) ×2 IMPLANT
CLAMP CORD UMBIL (MISCELLANEOUS) IMPLANT
CLOSURE STERI STRIP 1/2 X4 (GAUZE/BANDAGES/DRESSINGS) ×2 IMPLANT
CLOTH BEACON ORANGE TIMEOUT ST (SAFETY) ×2 IMPLANT
DRAPE C SECTION CLR SCREEN (DRAPES) ×2 IMPLANT
DRSG OPSITE POSTOP 4X10 (GAUZE/BANDAGES/DRESSINGS) ×2 IMPLANT
ELECT REM PT RETURN 9FT ADLT (ELECTROSURGICAL) ×2
ELECTRODE REM PT RTRN 9FT ADLT (ELECTROSURGICAL) ×1 IMPLANT
EXTRACTOR VACUUM KIWI (MISCELLANEOUS) IMPLANT
EXTRACTOR VACUUM M CUP 4 TUBE (SUCTIONS) IMPLANT
GLOVE BIO SURGEON STRL SZ7 (GLOVE) ×2 IMPLANT
GLOVE BIOGEL PI IND STRL 7.0 (GLOVE) ×2 IMPLANT
GLOVE BIOGEL PI INDICATOR 7.0 (GLOVE) ×2
GOWN STRL REUS W/TWL LRG LVL3 (GOWN DISPOSABLE) ×4 IMPLANT
KIT ABG SYR 3ML LUER SLIP (SYRINGE) IMPLANT
NEEDLE HYPO 25X5/8 SAFETYGLIDE (NEEDLE) IMPLANT
NS IRRIG 1000ML POUR BTL (IV SOLUTION) ×2 IMPLANT
PACK C SECTION WH (CUSTOM PROCEDURE TRAY) ×2 IMPLANT
PAD OB MATERNITY 4.3X12.25 (PERSONAL CARE ITEMS) ×2 IMPLANT
RTRCTR C-SECT PINK 25CM LRG (MISCELLANEOUS) IMPLANT
STRIP CLOSURE SKIN 1/2X4 (GAUZE/BANDAGES/DRESSINGS) IMPLANT
SUT MNCRL 0 VIOLET CTX 36 (SUTURE) ×2 IMPLANT
SUT MONOCRYL 0 CTX 36 (SUTURE) ×2
SUT PLAIN 0 NONE (SUTURE) IMPLANT
SUT PLAIN 2 0 (SUTURE)
SUT PLAIN ABS 2-0 CT1 27XMFL (SUTURE) IMPLANT
SUT VIC AB 0 CT1 27 (SUTURE) ×2
SUT VIC AB 0 CT1 27XBRD ANBCTR (SUTURE) ×2 IMPLANT
SUT VIC AB 2-0 CT1 27 (SUTURE) ×1
SUT VIC AB 2-0 CT1 TAPERPNT 27 (SUTURE) ×1 IMPLANT
SUT VIC AB 4-0 KS 27 (SUTURE) ×2 IMPLANT
SUT VICRYL 0 TIES 12 18 (SUTURE) IMPLANT
TOWEL OR 17X24 6PK STRL BLUE (TOWEL DISPOSABLE) ×2 IMPLANT
TRAY FOLEY W/BAG SLVR 14FR LF (SET/KITS/TRAYS/PACK) IMPLANT
WATER STERILE IRR 1000ML POUR (IV SOLUTION) ×2 IMPLANT

## 2020-11-16 NOTE — Anesthesia Postprocedure Evaluation (Signed)
Anesthesia Post Note  Patient: Tamara Pearson  Procedure(s) Performed: CESAREAN SECTION (N/A )     Patient location during evaluation: Mother Baby Anesthesia Type: Epidural Level of consciousness: awake and alert Pain management: pain level controlled Vital Signs Assessment: post-procedure vital signs reviewed and stable Respiratory status: spontaneous breathing, nonlabored ventilation and respiratory function stable Cardiovascular status: stable Postop Assessment: no headache, no backache and epidural receding Anesthetic complications: no   No complications documented.  Last Vitals:  Vitals:   11/16/20 1634 11/16/20 1800  BP:    Pulse:    Resp:  18  Temp:  37.1 C  SpO2: 96%     Last Pain:  Vitals:   11/16/20 1800  TempSrc: Oral  PainSc: 0-No pain   Pain Goal:                   Larose Batres

## 2020-11-16 NOTE — Progress Notes (Signed)
FHR tracing maternal due to patient leaning forward

## 2020-11-16 NOTE — Op Note (Addendum)
Cesarean Section Procedure Note   Tamara Pearson  11/16/2020 Procedure: Primary Low Transverse Cesarean section   Indications: Arrest of dilatation at 7 cm for 8 hours. IOL at 41.1 weeks, long latent phase then arrest in active phase. Persistent LOT at -2 and caput at -1. Maternal chorioamnionitis.   Pre-operative Diagnosis: Arrest of dilatation. Chorioamnionitis   Post-operative Diagnosis: Same   Surgeon: Shea Evans, MD   Assistants: Arlan Organ, CNM  Anesthesia: epidural   Procedure Details:  The patient was seen in the Labor Room and assessed over hours. IUPC noted adequate labor with arrest at 7 cm for 8 hours, so C-section was advised. The risks, benefits, complications, treatment options, and expected outcomes were discussed with the patient. The patient concurred with the proposed plan, giving informed consent. identified as Tamara Pearson and the procedure verified as C-Section Delivery. A Time Out was held and the above information confirmed. Due to Chorioamnionitis, she was on Cefoxitin 1st dose was 6 hrs prior to surgery, so repeat 2 gm Cefoxitin ordered and also Azithromycin 1gm for infection and PPROM.  After induction of anesthesia, the patient was draped and prepped in the usual sterile manner, foley was draining urine well.  A pfannenstiel incision was made and carried down through the subcutaneous tissue to the fascia. Fascial incision was made and extended transversely. The fascia was separated from the underlying rectus tissue superiorly and inferiorly. The peritoneum was identified and entered. Peritoneal incision was extended longitudinally. Alexis-O retractor placed. Bladder was not draining likely from wedged head. The utero-vesical peritoneal reflection was incised transversely and the bladder flap was bluntly freed from the lower uterine segment. RN Judeth Cornfield elevated wedged fetal head caudally to pelvic inlet and then a low transverse uterine incision was made.  Amniotic fluid was turbid. Delivered from cephalic LOT presentation was a Female infant with vigorous cry at 11.26 AM on 11/16/20.  Apgar scores of 8 at one minute and 9 at five minutes. Delayed cord clamping done at 1 minute and as cord pulsation stopped and baby handed to NICU team in attendance. Cord ph was not sent. Cord blood was obtained for evaluation. The placenta was removed Intact and appeared normal. The uterine outline noted both ends of the incision almost reaching lateral uterine edge but not active bleeding or extension to uterine vessels noted. Anterior surface of the uterus noted to have some inflammation, possible old endometriosis but no adhesions noted. Both tubes and ovaries appeared normal}. The uterine incision was closed with running locked sutures of . A second imbricating layer sutured. One figure of eight stitch with same suture placed at right angle. Hemostasis was observed. Alexis retractor removed. Bladder drained well after baby was delivered. Peritoneal closure done with 2-0 Vicryl.  The fascia was then reapproximated with running sutures of 0Vicryl. The subcuticular closure was performed using 2-0plain gut. The skin was closed with 4-0Vicryl. Sterile dressing placed incl steristrips and Honeycomb All counts correct at hysterotomy closure, abdominal closure and at the conclusion of the case.   Findings:BOY, LOT, wedged, with caput at -1, head elevated by vaginal assistance by [employer]. Hysterotomy extended to lateral uterine egde without uterine vessels or broad ligament involvement. Warm placenta, turbid amniotic fluid. Baby transitioned well.    Estimated Blood Loss: 350 cc   Total IV Fluids: LR and antibiotics 2200 ml   Urine Output: 300CC OF clear urine after head delivery  Specimens: cord blood and placenta    Complications: no complications  Disposition: PACU - hemodynamically  stable.   Maternal Condition: stable   Baby condition / location:  Couplet  care / Skin to Skin  Attending Attestation: I performed the procedure.   Signed: Surgeon(s): Shea Evans, MD

## 2020-11-16 NOTE — Progress Notes (Signed)
Tamara Pearson is a 25 y.o. G1P0 at [redacted]w[redacted]d by ultrasound admitted for induction of labor due to Post dates, 41.2 wks.  Subjective: Tired. Vomiting improved.   Objective: BP 129/78   Pulse 83   Temp 99.9 F (37.7 C) (Axillary)   Resp 16   Ht 5\' 2"  (1.575 m)   Wt 74 kg   LMP 02/01/2020 (Exact Date)   SpO2 99%   BMI 29.85 kg/m  I/O last 3 completed shifts: In: 187.5 [I.V.:187.5] Out: 100 [Urine:100] Total I/O In: -  Out: 250 [Urine:250]  FHR: 150 bpm, variability: moderate,  accelerations:  Present,  decelerations:  Occasional variable and occ late decel noted- still cat I  UC:   regular, every 3 minutes SVE:   Dilation: 3.5 Effacement (%): 100 Station: -2 Exam by:: Adamaris King, MD Head applied to cervix, caput noted. IUPC placed  Hematuria noted  Labial swelling ++   Assessment / Plan: IOL, protracted latent phase. Now 24 hrs since IOL.  S/p 2 Cytotecs and is on low dose pitocin FHT cat I  MOD working towards SVD, continue to assess, await active labor Right exaggerated [email or web address] 11/16/2020, 12:27 AM

## 2020-11-16 NOTE — Progress Notes (Signed)
Dr [name] discussing with patient and family concerns about delivering infant vaginally vs cesarean section

## 2020-11-16 NOTE — Progress Notes (Addendum)
Tamara Pearson is a 25 y.o. G1P0 at [redacted]w[redacted]d by ultrasound admitted for induction of labor due to Post dates, 41.2 wks.  Subjective: Tired. Vomiting improved but vomits if tries any water. Fever resolved.   Objective: BP 108/85   Pulse 83   Temp 100 F (37.8 C) (Axillary)   Resp 18   Ht 5\' 2"  (1.575 m)   Wt 74 kg   LMP 02/01/2020 (Exact Date)   SpO2 99%   BMI 29.85 kg/m \ Fever 100.8 at 3.50 AM, fetal tachycardia- improved with fluid and Cefoxitin   FHR: back to baseline of 150 bpm, variability: moderate,  accelerations:  Present,  decelerations:  Occasional variable and occ late decel noted- still cat I  UC:   regular, every 3 minutes, IUPC, MVUs adequate  SVE:   Dilation: 6.5 Effacement (%): 100 Station: -2 Exam by:: Dickie Labarre MD No descent and no further dilatation since 2 AM. Recheck at 8 AM  Hematuria noted   Assessment / Plan: IOL, protracted labor, possible arrest of dilatation, recheck at 8 AM and if no change, recc C-section.  Chorioamnionitis, started Cefoxitin 2 gm q 6 hrs and continue PP for 24 hours   FHT cat I   [phone] 11/16/2020, 6:56 AM

## 2020-11-16 NOTE — Transfer of Care (Signed)
Immediate Anesthesia Transfer of Care Note  Patient: Tamara Pearson  Procedure(s) Performed: CESAREAN SECTION (N/A )  Patient Location: PACU  Anesthesia Type:Epidural  Level of Consciousness: awake, alert  and oriented  Airway & Oxygen Therapy: Patient Spontanous Breathing  Post-op Assessment: Report given to RN and Post -op Vital signs reviewed and stable  Post vital signs: Reviewed and stable  Last Vitals:  Vitals Value Taken Time  BP 139/70 11/16/20 1233  Temp 37.7 C 11/16/20 1233  Pulse 101 11/16/20 1242  Resp 23 11/16/20 1242  SpO2 97 % 11/16/20 1242  Vitals shown include unvalidated device data.  Last Pain:  Vitals:   11/16/20 1233  TempSrc: Oral  PainSc: 0-No pain         Complications: No complications documented.

## 2020-11-16 NOTE — Lactation Note (Signed)
This note was copied from a baby's chart. Lactation Consultation Note  Patient Name: Tamara Pearson OVZCH'Y Date: 11/16/2020 Reason for consult: Initial assessment;Primapara;1st time breastfeeding;Term Age:27 hours Mom sitting in bed, states baby latched ~65min prior to [facility] entering the room. Maternal grandmother and FOB in the room. Baby observed latched to left breast football hold, mom pulling breast tissue away from infant. Mom states unsure if baby with correct latch, states latch is a little pinchy. Mom reports with Medela DEBP at home.  Mom broke latch, nipple everted without damage, slightly compressed. Mom re-latched, advised press breast tissue down towards baby vs away. Audible swallows noted, baby released breast on own ~54mins, nipple round on release. Taught hand expression, mom demonstrated and easily expressed few drops from right breast.   Discussed cue based feedings, expect 8-12 feeds a day, signs of adequate milk transfer, expected length of feeding, hand express after q feeding and offer back to baby, avoid pumping, pacifiers, and bottles x82mo unless indicated, continuous skin to skin while awake and alert. Mom voiced understanding and with no further concerns. Left the room with MGM burping baby, dad sitting in chair, and mom resting in bed. BGilliam, RN, IBCLC  Maternal Data Has patient been taught Hand Expression?: Yes Does the patient have breastfeeding experience prior to this delivery?: No  Feeding Mother's Current Feeding Choice: Breast Milk  LATCH Score Latch: Grasps breast easily, tongue down, lips flanged, rhythmical sucking.  Audible Swallowing: A few with stimulation  Type of Nipple: Everted at rest and after stimulation  Comfort (Breast/Nipple): Soft / non-tender  Hold (Positioning): Assistance needed to correctly position infant at breast and maintain latch.  LATCH Score: 8   Interventions Interventions: Breast feeding basics reviewed;Assisted  with latch;Skin to skin;Breast massage;Adjust position;Position options;Support pillows;Expressed milk  Discharge Pump: Personal (Medela Pump and Flex at home) [facility] Program: No  Consult Status Consult Status: Follow-up Date: 11/17/20 Follow-up type: In-patient    [name] 11/16/2020, 4:53 PM

## 2020-11-16 NOTE — Progress Notes (Signed)
Tamara Pearson is a 25 y.o. G1P0 at 41w2   Subjective: No new complaints    Objective: BP 122/80   Pulse 88   Temp 98.4 F (36.9 C) (Oral)   Resp 16   Ht 5\' 2"  (1.575 m)   Wt 74 kg   LMP 02/01/2020 (Exact Date)   SpO2 99%   BMI 29.85 kg/m \ Fever 100.8 at 3.50 AM, fetal tachycardia- improved with fluid and Cefoxitin   FHR: intermittent baseline change to 160s but mod variability, rare late decels with variability, + accels= overall cat I  UC:   regular, every 3 minutes, IUPC, MVUs adequate  SVE:   Dilation: 7 Effacement (%): 100 Station: -2 Exam by:: Dr [email or web address] LOT position persists No descent/ rotation and arrest of dilatation ( 8 hours) c/w CPD  Assessment / Plan: IOL, protracted labor, now arrest of dilatation, 8 hrs. Cephalopelvic disproportion. Recc C/section, patient agrees, is ready Chorioamnionitis, started Cefoxitin 2 gm q 6 hrs and continue PP for 24 hours   FHT cat I   Risks/complications of surgery reviewed incl infection, bleeding, damage to internal organs including bladder, bowels, ureters, blood vessels, other risks from anesthesia, VTE and delayed complications of any surgery, complications in future surgery reviewed. Also discussed neonatal complications incl difficult delivery, laceration, vacuum assistance, TTN etc. Pt understands and agrees, all concerns addressed.      Tamara Pearson 11/16/2020, 10:33 AM

## 2020-11-16 NOTE — Progress Notes (Signed)
Tamara Pearson is a 25 y.o. G1P0 at [redacted]w[redacted]d by ultrasound admitted for induction of labor due to Post dates, 41.2 wks.  Subjective: No new complaints    Objective: BP 124/80   Pulse 88   Temp 98.2 F (36.8 C) (Oral)   Resp 18   Ht 5\' 2"  (1.575 m)   Wt 74 kg   LMP 02/01/2020 (Exact Date)   SpO2 99%   BMI 29.85 kg/m \ Fever 100.8 at 3.50 AM, fetal tachycardia- improved with fluid and Cefoxitin   FHR: back to baseline of 150 bpm, variability: moderate,  accelerations:  Present,  decelerations:  Occasional variable and occ late decel noted- still cat I  UC:   regular, every 3 minutes, IUPC, MVUs adequate  SVE:   Dilation: 6.5 Effacement (%): 100 Station: -2 Exam by:: Dr [email or web address] No descent and no further dilatation since 2 AM.  Assessment / Plan: IOL, protracted labor, now arrest of dilatation, 6 hrs. pt wants till 12 noon reviewed need to reassess at 10 am and if no change recc C/s to prevent risk of complications.  Chorioamnionitis, started Cefoxitin 2 gm q 6 hrs and continue PP for 24 hours   FHT cat I   [name] 11/16/2020, 9:36 AM

## 2020-11-17 DIAGNOSIS — D62 Acute posthemorrhagic anemia: Secondary | ICD-10-CM | POA: Diagnosis not present

## 2020-11-17 DIAGNOSIS — D696 Thrombocytopenia, unspecified: Secondary | ICD-10-CM

## 2020-11-17 DIAGNOSIS — O99119 Other diseases of the blood and blood-forming organs and certain disorders involving the immune mechanism complicating pregnancy, unspecified trimester: Secondary | ICD-10-CM | POA: Diagnosis present

## 2020-11-17 DIAGNOSIS — O411290 Chorioamnionitis, unspecified trimester, not applicable or unspecified: Secondary | ICD-10-CM | POA: Diagnosis not present

## 2020-11-17 LAB — CBC
HCT: 30.9 % — ABNORMAL LOW (ref 36.0–46.0)
Hemoglobin: 10.3 g/dL — ABNORMAL LOW (ref 12.0–15.0)
MCH: 31.6 pg (ref 26.0–34.0)
MCHC: 33.3 g/dL (ref 30.0–36.0)
MCV: 94.8 fL (ref 80.0–100.0)
Platelets: 130 10*3/uL — ABNORMAL LOW (ref 150–400)
RBC: 3.26 MIL/uL — ABNORMAL LOW (ref 3.87–5.11)
RDW: 13.6 % (ref 11.5–15.5)
WBC: 18.8 10*3/uL — ABNORMAL HIGH (ref 4.0–10.5)
nRBC: 0 % (ref 0.0–0.2)

## 2020-11-17 MED ORDER — ACETAMINOPHEN 500 MG PO TABS
1000.0000 mg | ORAL_TABLET | Freq: Four times a day (QID) | ORAL | Status: DC
  Administered 2020-11-17 – 2020-11-18 (×5): 1000 mg via ORAL
  Filled 2020-11-17 (×5): qty 2

## 2020-11-17 MED ORDER — POLYSACCHARIDE IRON COMPLEX 150 MG PO CAPS
150.0000 mg | ORAL_CAPSULE | Freq: Every day | ORAL | Status: DC
  Administered 2020-11-18: 150 mg via ORAL
  Filled 2020-11-17: qty 1

## 2020-11-17 NOTE — Lactation Note (Signed)
This note was copied from a baby's chart. Lactation Consultation Note  Patient Name: Tamara Pearson Date: 11/17/2020 Reason for consult: Follow-up assessment;Term;Primapara;1st time breastfeeding Age:27 hours   P1 mother whose infant is now 67 hours old.  This is a term baby at 41+2 weeks.  Hearing screen in process when I arrived.  Reviewed basic breast feeding education with parents while screening was being completed.  After completion mother demonstrated hand expression; unable to express drops at this time.  Reassurance provided.  Assisted to latch in the football hold to the right breast.  Observed baby feeding for 12 minutes while continuing to educate and answer mother's questions.  Upon self release, mother's nipple rounded.  Mother will continue to feed on cue and include hand expression before/after feedings.  Discussed finger feeding and spoon feeding for any EBM.  Mother will call for latch assistance as needed.  Father present and interested in learning to swaddle.  Demonstrated one way of swaddling and encouraged father to practice.    Mother has a Medela DEBP for home use.   Maternal Data Has patient been taught Hand Expression?: Yes Does the patient have breastfeeding experience prior to this delivery?: No  Feeding Mother's Current Feeding Choice: Breast Milk  LATCH Score Latch: Grasps breast easily, tongue down, lips flanged, rhythmical sucking.  Audible Swallowing: A few with stimulation  Type of Nipple: Everted at rest and after stimulation  Comfort (Breast/Nipple): Soft / non-tender  Hold (Positioning): Assistance needed to correctly position infant at breast and maintain latch.  LATCH Score: 8   Lactation Tools Discussed/Used    Interventions Interventions: Breast feeding basics reviewed;Assisted with latch;Skin to skin;Breast massage;Breast compression;Adjust position;Position options;Support pillows;Education (Mother has her own personal  nipple balm)  Discharge Pump: Personal  Consult Status Consult Status: Follow-up Date: 11/18/20 Follow-up type: In-patient    [name] R Adda Stokes 11/17/2020, 9:29 AM

## 2020-11-17 NOTE — Progress Notes (Addendum)
POSTOPERATIVE DAY # 1 S/P Primary LTCS for arrest of dilatation, maternal chorioamnionitis, baby boy "Gerri Spore"   S:         Reports feeling okay, tired and sore. Has not been able to rest much. C/o malodorous lochia when changing pad earlier.              Tolerating po intake / no nausea / no vomiting / + flatus / no BM  Denies dizziness, SOB, or CP             Bleeding is moderate             Pain controlled withToradol, Tylenol, Oxycodone IR             Up ad lib / ambulatory/ voiding x 1 since foley removal   Newborn breast feeding - going well   / Circumcision - planning prior to d/c   O:  VS: BP 108/73 (BP Location: Left Arm)   Pulse 62   Temp 98.6 F (37 C) (Oral)   Resp 18   Ht 5\' 2"  (1.575 m)   Wt 74 kg   LMP 02/01/2020 (Exact Date)   SpO2 96%   Breastfeeding Unknown   BMI 29.85 kg/m   Patient Vitals for the past 24 hrs:  BP Temp Temp src Pulse Resp SpO2  11/17/20 0500 108/73 98.6 F (37 C) Oral 62 18 --  11/17/20 0400 -- -- -- -- 17 --  11/17/20 0200 112/70 98.5 F (36.9 C) Oral 65 15 --  11/17/20 0000 -- -- -- -- 17 --  11/16/20 2200 117/80 98.2 F (36.8 C) Oral 75 18 --  11/16/20 2000 -- -- -- -- 16 --  11/16/20 1800 -- 98.8 F (37.1 C) Oral -- 18 --  11/16/20 1634 -- -- -- -- -- 96 %  11/16/20 1530 -- -- -- -- 18 96 %  11/16/20 1500 -- -- -- -- -- 97 %  11/16/20 1455 (!) 139/94 99.1 F (37.3 C) Oral 72 18 --  11/16/20 1359 127/69 98.8 F (37.1 C) Oral 88 18 99 %  11/16/20 1337 126/77 98.9 F (37.2 C) Oral 89 17 98 %  11/16/20 1315 118/80 -- -- 81 20 96 %  11/16/20 1300 123/68 -- -- 90 (!) 21 97 %  11/16/20 1245 113/65 -- -- (!) 101 (!) 21 98 %  11/16/20 1233 139/70 99.9 F (37.7 C) Oral 97 18 99 %     LABS:              Recent Labs    11/15/20 0033 11/17/20 0453  WBC 11.4* 18.8*  HGB 12.9 10.3*  PLT 127* 130*               Bloodtype: --/--/O POS (05/28 0033)  Rubella:       Immune                                        I&O: Intake/Output       05/29 0701 05/30 0700 05/30 0701 05/31 0700   I.V. (mL/kg) 2210.8 (29.9)    IV Piggyback 494.2    Total Intake(mL/kg) 2705 (36.6)    Urine (mL/kg/hr) 3225 (1.8) 400 (1.6)   Blood 296    Total Output 3521 400   Net -816 -400          Flu: declined Tdap:declined Covid:declined  Physical Exam:             Alert and Oriented X3  Lungs: Clear and unlabored  Heart: regular rate and rhythm / no murmurs  Abdomen: soft, non-tender, moderate gaseous distention, active bowel sounds in all quadrants              Fundus: firm, non-tender, below umbilicus              Dressing: honeycomb with steri-strips, old shadowing marked, otherwise c/d/i              Incision:  approximated with sutures / no erythema / no ecchymosis / no drainage  Perineum: intact   Lochia: appropriate, no odors appreciated on exam   Extremities: +1 pedal/ankle edema, no calf pain or tenderness,   A/P:      POD # 1 S/P Primary LTCS  Maternal Chorioamnionitis   - Cefoxitin every 6 hrs x 24 hrs   - WBC appropriate this morning   - exam benign, continue to monitor for s/s of infection   - Afebrile since 5/29 at 1455 (low grade temp 99.1)  Gestational thrombocytopenia on admission   - plts stable             ABL Anemia    - Begin Niferex 150mg  PO daily tomorrow when bowel function improves  Dependent edema   Ambulation encouraged   Increase Tylenol 1000mg  every 6hrs scheduled   Lactation support PRN   Routine postoperative care   Circ prior to d/c             Desires early d/c home tomorrow if stable   , MSN, CNM [facility]

## 2020-11-18 MED ORDER — POLYSACCHARIDE IRON COMPLEX 150 MG PO CAPS
150.0000 mg | ORAL_CAPSULE | Freq: Every day | ORAL | 1 refills | Status: AC
Start: 2020-11-18 — End: ?

## 2020-11-18 MED ORDER — OXYCODONE HCL 5 MG PO TABS: 5.0000 mg | ORAL_TABLET | ORAL | 0 refills | Status: DC | PRN

## 2020-11-18 MED ORDER — ACETAMINOPHEN 500 MG PO TABS
1000.0000 mg | ORAL_TABLET | Freq: Four times a day (QID) | ORAL | 0 refills | Status: AC
Start: 2020-11-18 — End: ?

## 2020-11-18 MED ORDER — IBUPROFEN 600 MG PO TABS
600.0000 mg | ORAL_TABLET | Freq: Four times a day (QID) | ORAL | 0 refills | Status: AC
Start: 2020-11-18 — End: ?

## 2020-11-18 NOTE — Discharge Summary (Signed)
OB Discharge Summary  Patient Name: Tamara Pearson DOB: 1995-12-11 MRN: [id number]  Date of admission: 11/15/2020 Delivering provider: MODY, VAISHALI   Admitting diagnosis: Encounter for induction of labor [Z34.90] Delivery by emergency cesarean [O99.892] Intrauterine pregnancy: [redacted]w[redacted]d     Secondary diagnosis: Patient Active Problem List   Diagnosis Date Noted  . Chorioamnionitis, delivered, current hospitalization 11/17/2020  . Gestational thrombocytopenia (HCC) 11/17/2020  . Acute blood loss anemia 11/17/2020  . Delivery by emergency cesarean 11/16/2020  . Postpartum care following cesarean delivery 5/29 11/16/2020  . Encounter for induction of labor 11/15/2020    Date of discharge: 11/18/2020   Discharge diagnosis: Principal Problem:   Postpartum care following cesarean delivery 5/29 Active Problems:   Encounter for induction of labor   Delivery by emergency cesarean   Chorioamnionitis, delivered, current hospitalization   Gestational thrombocytopenia (HCC)   Acute blood loss anemia                                                           Post partum procedures:None  Augmentation: Pitocin and Cytotec Pain control: Epidural  Laceration:None  Episiotomy:None  Complications: None  Hospital course:  Induction of Labor With Cesarean Section   25 y.o. yo G1P1001 at [redacted]w[redacted]d was admitted to the hospital 11/15/2020 for induction of labor. Patient had a labor course significant for failure to descend. The patient went for cesarean section due to Arrest of Descent. Delivery details are as follows: Membrane Rupture Time/Date: 4:41 PM ,11/15/2020   Delivery Method:C-Section, Low Transverse  Details of operation can be found in separate operative Note.  Patient had an uncomplicated postpartum course. She is ambulating, tolerating a regular diet, passing flatus, and urinating well.  Patient is discharged home in stable condition on 11/18/20.      Newborn Data: Birth date:11/16/2020  Birth  time:11:26 AM  Gender:Female  Living status:Living  Apgars:8 ,9  Weight:3530 g                                Physical exam  Vitals:   11/17/20 0500 11/17/20 1529 11/17/20 2010 11/18/20 0630  BP: 108/73 103/62 125/74 (!) 122/91  Pulse: 62 85 78 69  Resp: 18 16 16 18   Temp: 98.6 F (37 C) 98 F (36.7 C) 98.1 F (36.7 C) 98.2 F (36.8 C)  TempSrc: Oral Oral Oral   SpO2:  96% 100%   Weight:      Height:       General: alert, cooperative and no distress Lochia: appropriate Uterine Fundus: firm Incision: Healing well with no significant drainage, Dressing is clean, dry, and intact DVT Evaluation: No evidence of DVT seen on physical exam. No significant calf/ankle edema. Labs: Lab Results  Component Value Date   WBC 18.8 (H) 11/17/2020   HGB 10.3 (L) 11/17/2020   HCT 30.9 (L) 11/17/2020   MCV 94.8 11/17/2020   PLT 130 (L) 11/17/2020   No flowsheet data found. Edinburgh Postnatal Depression Scale Screening Tool 11/17/2020  I have been able to laugh and see the funny side of things. 0  I have looked forward with enjoyment to things. 0  I have blamed myself unnecessarily when things went wrong. 1  I have been anxious or worried for no good reason.  1  I have felt scared or panicky for no good reason. 1  Things have been getting on top of me. 1  I have been so unhappy that I have had difficulty sleeping. 0  I have felt sad or miserable. 0  I have been so unhappy that I have been crying. 0  The thought of harming myself has occurred to me. 0  Edinburgh Postnatal Depression Scale Total 4   Vaccines: TDaP Declined         COVID-19   Declined  Discharge instructions:  per After Visit Summary  After Visit Meds:  Allergies as of 11/18/2020      Reactions   Soy Allergy Hives      Medication List    STOP taking these medications   acyclovir 800 MG tablet Commonly known as: ZOVIRAX   cyclobenzaprine 10 MG tablet Commonly known as: FLEXERIL   HYDROcodone-acetaminophen  5-325 MG tablet Commonly known as: NORCO/VICODIN   ondansetron 4 MG disintegrating tablet Commonly known as: Zofran ODT   valACYclovir 1000 MG tablet Commonly known as: VALTREX     TAKE these medications   acetaminophen 500 MG tablet Commonly known as: TYLENOL Take 2 tablets (1,000 mg total) by mouth every 6 (six) hours.   ibuprofen 600 MG tablet Commonly known as: ADVIL Take 1 tablet (600 mg total) by mouth every 6 (six) hours.   iron polysaccharides 150 MG capsule Commonly known as: Ferrex 150 Take 1 capsule (150 mg total) by mouth daily.   oxyCODONE 5 MG immediate release tablet Commonly known as: Oxy IR/ROXICODONE Take 1 tablet (5 mg total) by mouth every 4 (four) hours as needed for moderate pain.      Diet: routine diet  Activity: Advance as tolerated. Pelvic rest for 6 weeks.   Newborn Data: Live born female  Birth Weight: 7 lb 12.5 oz (3530 g) APGAR: 8, 9  Newborn Delivery   Birth date/time: 11/16/2020 11:26:00 Delivery type: C-Section, Low Transverse Trial of labor: Yes C-section categorization: Primary      [facility] Baby Feeding: Breast Disposition:home with mother  Delivery Report:  Review the Delivery Report for details.    Follow up:  Follow-up Information    Shea Evans, MD. Schedule an appointment as soon as possible for a visit in 6 week(s).   Specialty: Obstetrics and Gynecology Contact information: [address] [place] [address] [phone]              Clancy Gourd, MSN 11/18/2020, 10:17 AM

## 2020-11-18 NOTE — Discharge Instructions (Signed)

## 2020-11-18 NOTE — Lactation Note (Signed)
This note was copied from a baby's chart. Lactation Consultation Note  Patient Name: Tamara Pearson [id number] Date: 11/18/2020 Reason for consult: Follow-up assessment Age:27 hours   Mother reports that they would like assistance with burping infant. Teaching done on burping and using good neck and head support for infant.  Mother in position to latch infant on in football hold. Assist with latch.  Mother reports that latch initially hurts. Taught mother how to flange infants lips for wider gape.  Mother reports that she has been leaking since 16 weeks.  Mother breast are firm and filling.  Infant gulping and unable to keep up with flow.  Unlatched infant for several seconds and had mother to apply pressure to breast with palm of hand to slow flow. Infant sustained latch for 20  Mins.   Reviewed treatment and prevention of engorgement.  Infant has a tongue tie. Suggested that mother follow up with a LC and a Peds Specialist . Hand out given with resources.      Maternal Data    Feeding Mother's Current Feeding Choice: Breast Milk  LATCH Score Latch: Grasps breast easily, tongue down, lips flanged, rhythmical sucking.  Audible Swallowing: Spontaneous and intermittent  Type of Nipple: Everted at rest and after stimulation  Comfort (Breast/Nipple): Filling, red/small blisters or bruises, mild/mod discomfort (mother has compression strips both nipples.)  Hold (Positioning): Assistance needed to correctly position infant at breast and maintain latch.  LATCH Score: 8   Lactation Tools Discussed/Used    Interventions Interventions: Assisted with latch;Skin to skin;Hand express;Adjust position;Support pillows;Position options  Discharge Discharge Education: Engorgement and breast care;Warning signs for feeding baby;Outpatient recommendation  Consult Status Consult Status: Complete    [name] 11/18/2020, 1:28 PM

## 2020-11-19 LAB — SURGICAL PATHOLOGY

## 2021-06-06 IMAGING — US US RENAL
1 series · 14 of 25 positions shown · non-contrast
Comparison: None.

CLINICAL DATA: Hematuria

EXAM:
RENAL / URINARY TRACT ULTRASOUND COMPLETE

[Series 1: us renal · 0.20mm/px · 14 of 31 slices shown]
[im 1/31]
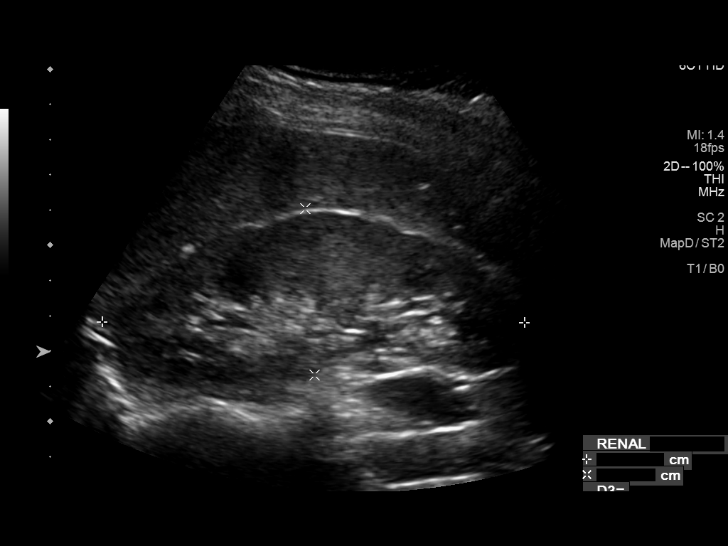
[im 3/31]
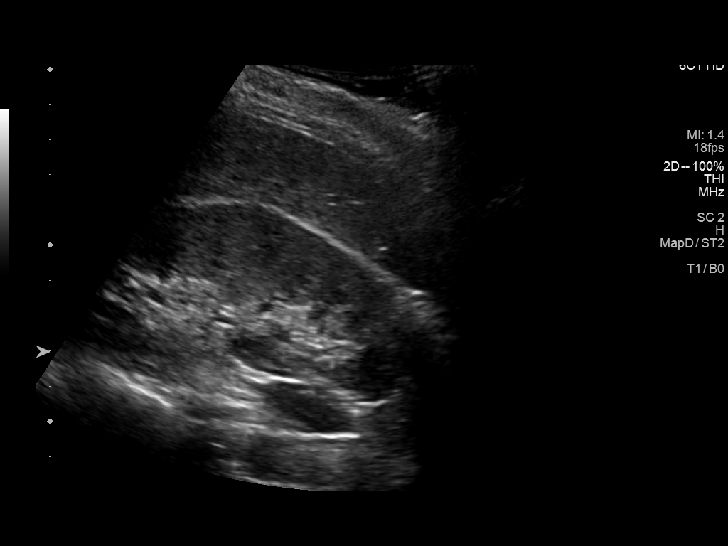
[im 6/31]
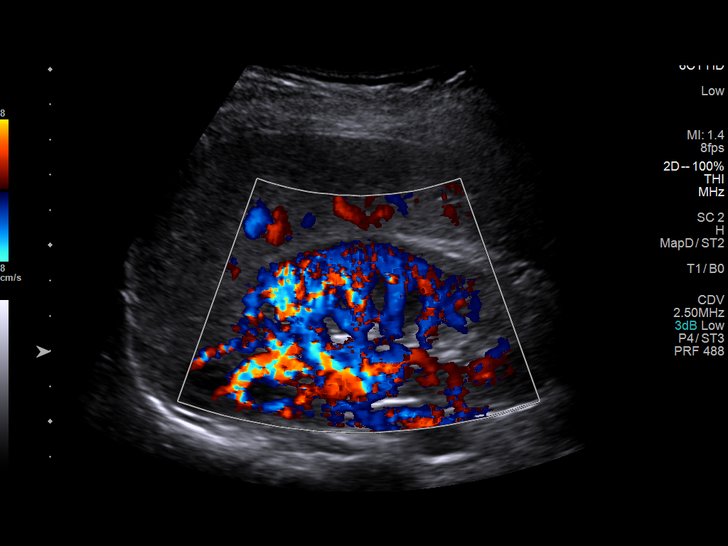
[im 8/31]
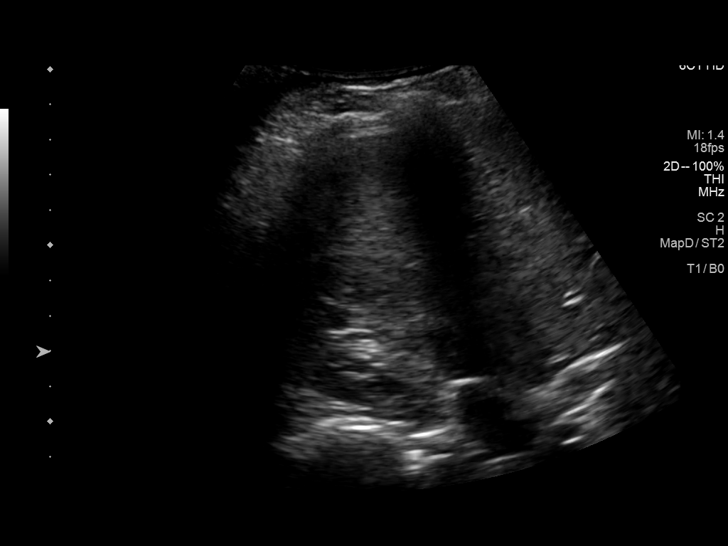
[im 11/31]
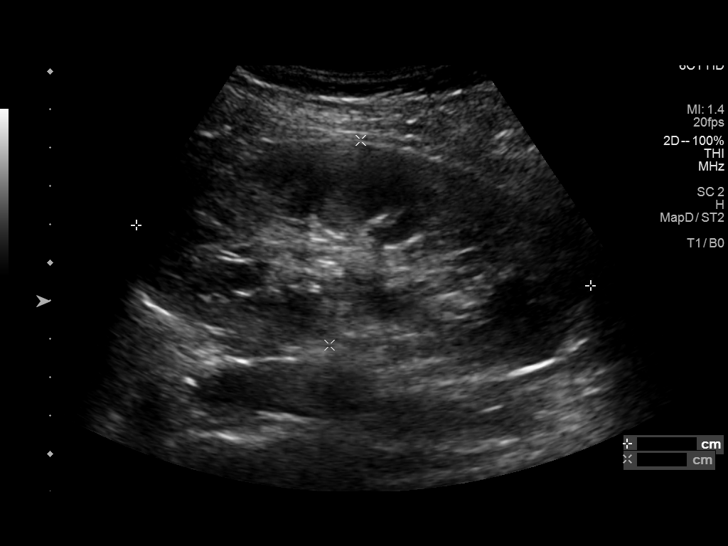
[im 12/31]
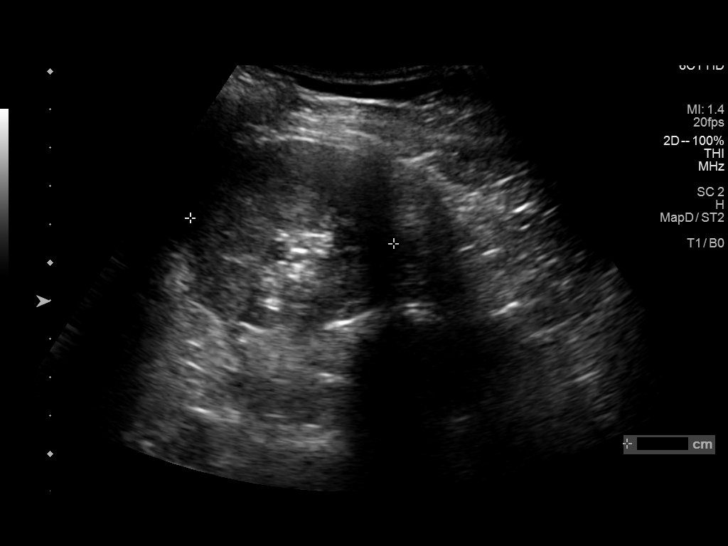
[im 14/31]
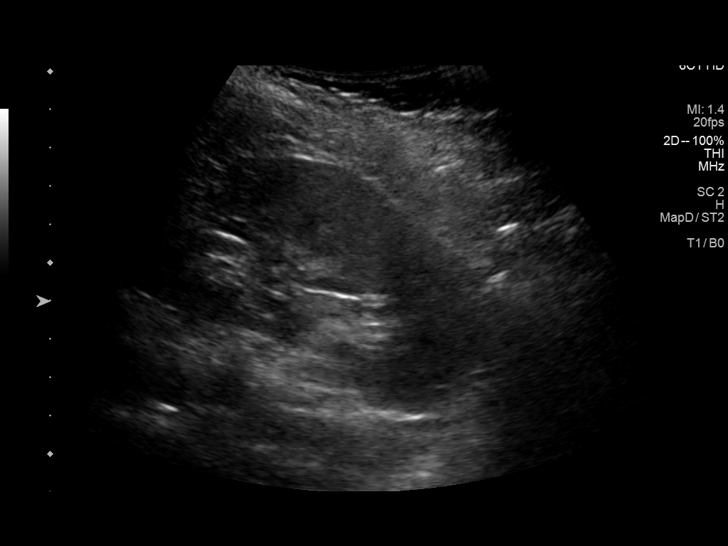
[im 17/31]
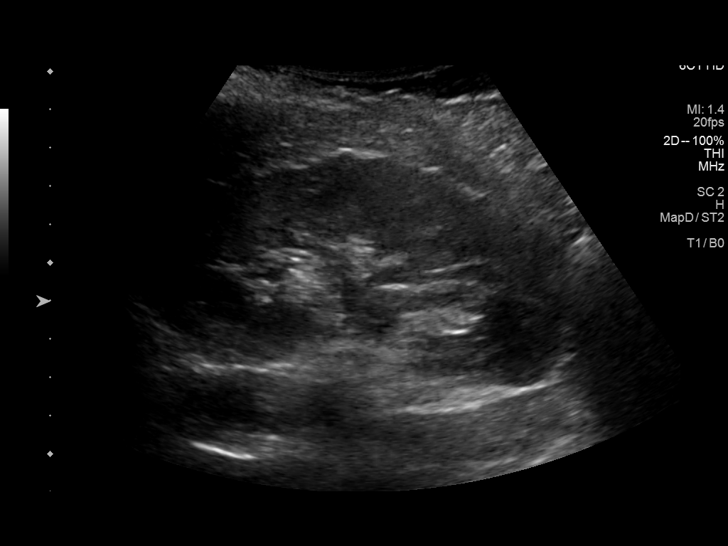
[im 19/31]
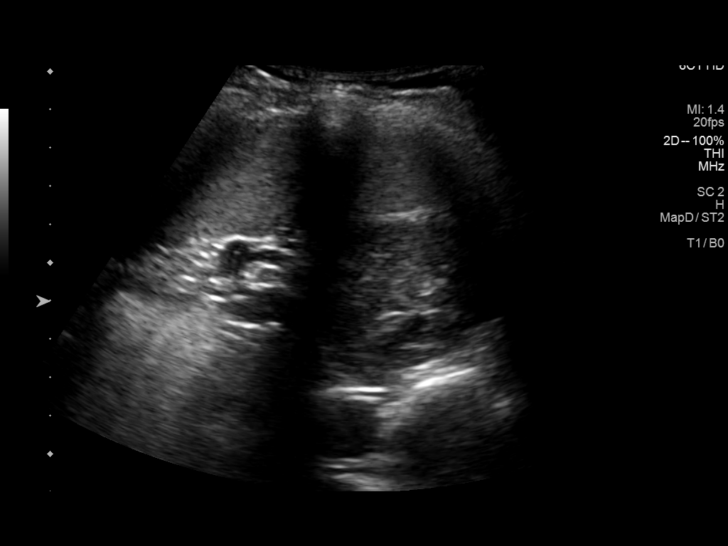
[im 21/31]
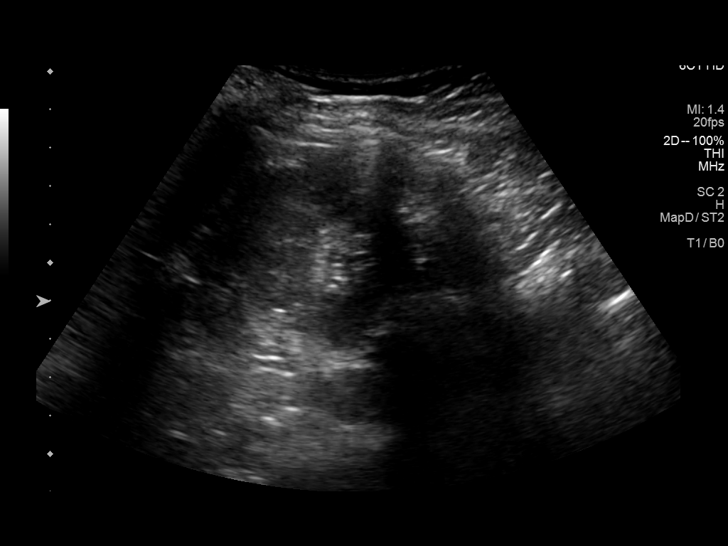
[im 23/31]
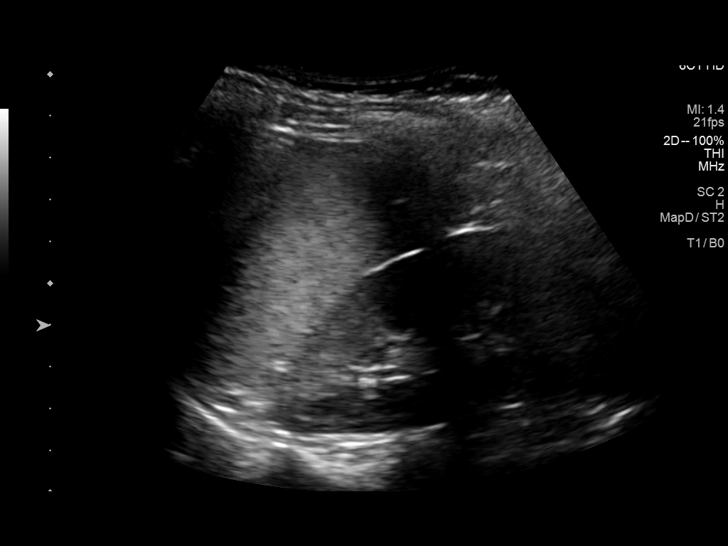
[im 26/31]
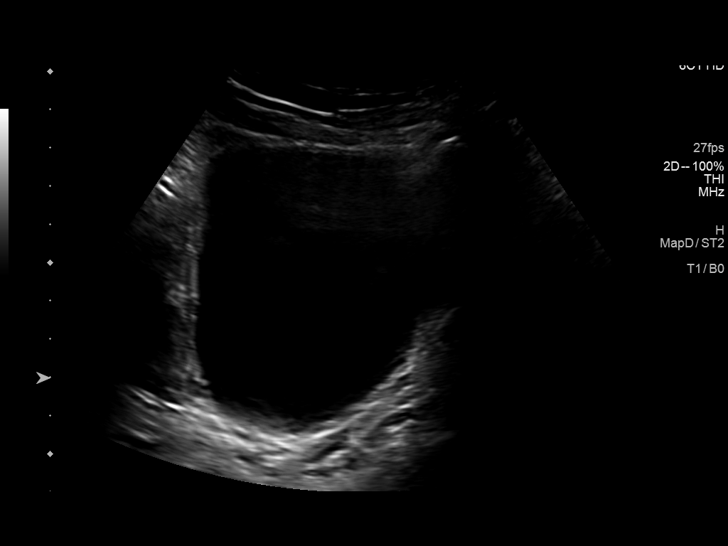
[im 28/31]
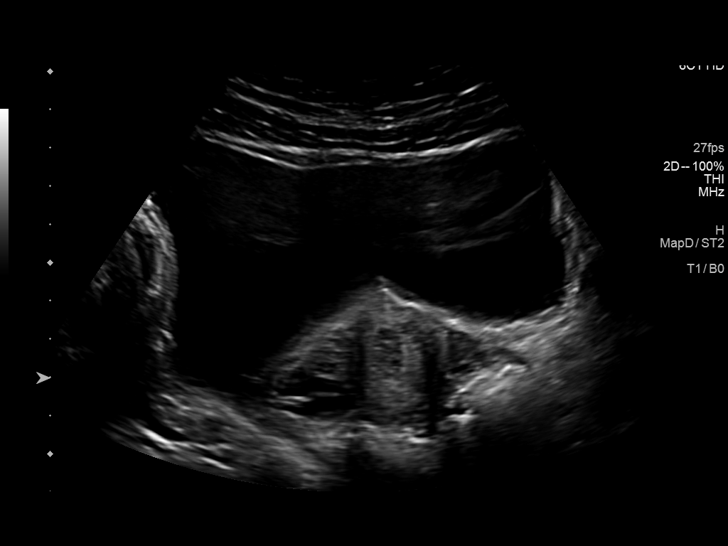
[im 31/31]
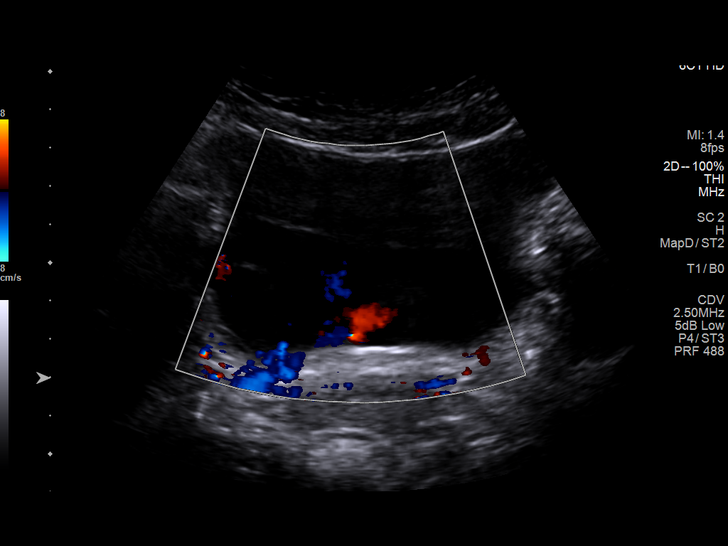

[14 of 25 positions shown; findings below may reference images not displayed]

FINDINGS: Right Kidney:

Renal measurements: 12.0 x 4.7 x 4.9 cm. = volume: 146 mL.
Echogenicity within normal limits. No mass or hydronephrosis
visualized.

Left Kidney:

Renal measurements: 12.0 x 5.4 x 5.4 cm. = volume: 186 mL.
Echogenicity within normal limits. No mass or hydronephrosis
visualized.

Bladder:

Appears normal for degree of bladder distention.

Other:

None.
IMPRESSION: No acute abnormality noted.

## 2021-08-26 ENCOUNTER — Telehealth: Payer: Managed Care, Other (non HMO) | Admitting: Physician Assistant

## 2021-08-26 DIAGNOSIS — J069 Acute upper respiratory infection, unspecified: Secondary | ICD-10-CM

## 2021-08-26 MED ORDER — FLUTICASONE PROPIONATE 50 MCG/ACT NA SUSP: 2.0000 | Freq: Every day | NASAL | 0 refills | Status: DC

## 2021-08-26 MED ORDER — BENZONATATE 100 MG PO CAPS: 100.0000 mg | ORAL_CAPSULE | Freq: Three times a day (TID) | ORAL | 0 refills | Status: AC

## 2021-08-26 NOTE — Progress Notes (Signed)

## 2022-04-01 ENCOUNTER — Telehealth: Payer: Self-pay | Admitting: Family

## 2022-04-01 DIAGNOSIS — B009 Herpesviral infection, unspecified: Secondary | ICD-10-CM

## 2022-04-01 MED ORDER — VALACYCLOVIR HCL 1 G PO TABS: 2000.0000 mg | ORAL_TABLET | Freq: Two times a day (BID) | ORAL | 0 refills | Status: AC

## 2022-04-01 NOTE — Progress Notes (Signed)

## 2024-02-07 NOTE — Progress Notes (Signed)
 Routine prenatal visit  24 weeks 28 week labs & packet given No problems today

## 2024-02-23 NOTE — Telephone Encounter (Signed)
 Received ROI from [facility]

## 2024-03-08 DIAGNOSIS — Z3480 Encounter for supervision of other normal pregnancy, unspecified trimester: Secondary | ICD-10-CM | POA: Diagnosis not present

## 2024-03-08 DIAGNOSIS — Z3483 Encounter for supervision of other normal pregnancy, third trimester: Secondary | ICD-10-CM | POA: Diagnosis not present

## 2024-03-08 DIAGNOSIS — Z1331 Encounter for screening for depression: Secondary | ICD-10-CM | POA: Diagnosis not present

## 2024-03-08 DIAGNOSIS — Z3689 Encounter for other specified antenatal screening: Secondary | ICD-10-CM | POA: Diagnosis not present

## 2024-03-08 NOTE — Telephone Encounter (Signed)
 Reached patient by phone- Patient relocated to [facility]- no further appts needed.

## 2024-03-09 LAB — OB RESULTS CONSOLE GC/CHLAMYDIA
Chlamydia: NEGATIVE
Neisseria Gonorrhea: NEGATIVE

## 2024-03-09 LAB — HEPATITIS C ANTIBODY: HCV Ab: NEGATIVE

## 2024-03-09 LAB — OB RESULTS CONSOLE HEPATITIS B SURFACE ANTIGEN: Hepatitis B Surface Ag: NEGATIVE

## 2024-03-09 LAB — OB RESULTS CONSOLE RUBELLA ANTIBODY, IGM: Rubella: IMMUNE

## 2024-03-09 LAB — OB RESULTS CONSOLE HIV ANTIBODY (ROUTINE TESTING): HIV: NONREACTIVE

## 2024-04-02 DIAGNOSIS — Z3482 Encounter for supervision of other normal pregnancy, second trimester: Secondary | ICD-10-CM | POA: Diagnosis not present

## 2024-04-02 DIAGNOSIS — Z3483 Encounter for supervision of other normal pregnancy, third trimester: Secondary | ICD-10-CM | POA: Diagnosis not present

## 2024-05-03 DIAGNOSIS — Z3685 Encounter for antenatal screening for Streptococcus B: Secondary | ICD-10-CM | POA: Diagnosis not present

## 2024-05-03 LAB — OB RESULTS CONSOLE GBS: GBS: NEGATIVE

## 2024-05-29 ENCOUNTER — Inpatient Hospital Stay (HOSPITAL_COMMUNITY): Admit: 2024-05-29 | Payer: Self-pay

## 2024-05-31 DIAGNOSIS — Z3A40 40 weeks gestation of pregnancy: Secondary | ICD-10-CM | POA: Diagnosis not present

## 2024-05-31 DIAGNOSIS — Z3483 Encounter for supervision of other normal pregnancy, third trimester: Secondary | ICD-10-CM | POA: Diagnosis not present

## 2024-05-31 DIAGNOSIS — Z98891 History of uterine scar from previous surgery: Secondary | ICD-10-CM | POA: Diagnosis not present

## 2024-06-03 ENCOUNTER — Encounter (HOSPITAL_COMMUNITY): Payer: Self-pay | Admitting: Obstetrics

## 2024-06-03 ENCOUNTER — Other Ambulatory Visit: Payer: Self-pay

## 2024-06-03 ENCOUNTER — Inpatient Hospital Stay (HOSPITAL_COMMUNITY): Admitting: Anesthesiology

## 2024-06-03 ENCOUNTER — Inpatient Hospital Stay (HOSPITAL_COMMUNITY)
Admission: AD | Admit: 2024-06-03 | Discharge: 2024-06-06 | DRG: 786 | Disposition: A | Attending: Obstetrics & Gynecology | Admitting: Obstetrics & Gynecology

## 2024-06-03 DIAGNOSIS — R030 Elevated blood-pressure reading, without diagnosis of hypertension: Secondary | ICD-10-CM | POA: Diagnosis not present

## 2024-06-03 DIAGNOSIS — O34211 Maternal care for low transverse scar from previous cesarean delivery: Secondary | ICD-10-CM | POA: Diagnosis not present

## 2024-06-03 DIAGNOSIS — O480 Post-term pregnancy: Secondary | ICD-10-CM

## 2024-06-03 DIAGNOSIS — Z3A40 40 weeks gestation of pregnancy: Secondary | ICD-10-CM

## 2024-06-03 DIAGNOSIS — Z3A00 Weeks of gestation of pregnancy not specified: Secondary | ICD-10-CM | POA: Diagnosis not present

## 2024-06-03 DIAGNOSIS — O26893 Other specified pregnancy related conditions, third trimester: Secondary | ICD-10-CM | POA: Diagnosis not present

## 2024-06-03 DIAGNOSIS — O99892 Other specified diseases and conditions complicating childbirth: Secondary | ICD-10-CM | POA: Diagnosis present

## 2024-06-03 DIAGNOSIS — Z412 Encounter for routine and ritual male circumcision: Secondary | ICD-10-CM | POA: Diagnosis not present

## 2024-06-03 DIAGNOSIS — O411230 Chorioamnionitis, third trimester, not applicable or unspecified: Secondary | ICD-10-CM | POA: Diagnosis not present

## 2024-06-03 DIAGNOSIS — Z8249 Family history of ischemic heart disease and other diseases of the circulatory system: Secondary | ICD-10-CM | POA: Diagnosis not present

## 2024-06-03 LAB — PROTEIN / CREATININE RATIO, URINE
Creatinine, Urine: 39 mg/dL
Total Protein, Urine: <6 mg/dL

## 2024-06-03 LAB — COMPREHENSIVE METABOLIC PANEL WITH GFR
ALT: 13 U/L (ref 0–44)
AST: 24 U/L (ref 15–41)
Albumin: 3 g/dL — ABNORMAL LOW (ref 3.5–5.0)
Alkaline Phosphatase: 169 U/L — ABNORMAL HIGH (ref 38–126)
Anion gap: 15 (ref 5–15)
BUN: 5 mg/dL — ABNORMAL LOW (ref 6–20)
CO2: 16 mmol/L — ABNORMAL LOW (ref 22–32)
Calcium: 9.2 mg/dL (ref 8.9–10.3)
Chloride: 103 mmol/L (ref 98–111)
Creatinine, Ser: 0.52 mg/dL (ref 0.44–1.00)
GFR, Estimated: >60 mL/min (ref >60)
Glucose, Bld: 99 mg/dL (ref 70–99)
Potassium: 4 mmol/L (ref 3.5–5.1)
Sodium: 134 mmol/L — ABNORMAL LOW (ref 135–145)
Total Bilirubin: 0.7 mg/dL (ref 0.0–1.2)
Total Protein: 6.5 g/dL (ref 6.5–8.1)

## 2024-06-03 LAB — TYPE AND SCREEN
ABO/RH(D): O POS
Antibody Screen: NEGATIVE

## 2024-06-03 LAB — CBC
HCT: 35.7 % — ABNORMAL LOW (ref 36.0–46.0)
Hemoglobin: 12.6 g/dL (ref 12.0–15.0)
MCH: 30.7 pg (ref 26.0–34.0)
MCHC: 35.3 g/dL (ref 30.0–36.0)
MCV: 86.9 fL (ref 80.0–100.0)
Platelets: 191 K/uL (ref 150–400)
RBC: 4.11 MIL/uL (ref 3.87–5.11)
RDW: 14 % (ref 11.5–15.5)
WBC: 15.8 K/uL — ABNORMAL HIGH (ref 4.0–10.5)
nRBC: 0 % (ref 0.0–0.2)

## 2024-06-03 LAB — SYPHILIS: RPR W/REFLEX TO RPR TITER AND TREPONEMAL ANTIBODIES, TRADITIONAL SCREENING AND DIAGNOSIS ALGORITHM: RPR Ser Ql: NONREACTIVE

## 2024-06-03 MED ORDER — SODIUM CHLORIDE 0.9 % IV SOLN
INTRAVENOUS | Status: DC | PRN
  Administered 2024-06-03: 10:00:00 6 mL via EPIDURAL

## 2024-06-03 MED ORDER — ONDANSETRON HCL 4 MG/2ML IJ SOLN
4.0000 mg | Freq: Four times a day (QID) | INTRAMUSCULAR | Status: DC | PRN
  Administered 2024-06-03 – 2024-06-04 (×3): 4 mg via INTRAVENOUS
  Filled 2024-06-03 (×2): qty 2

## 2024-06-03 MED ORDER — OXYTOCIN BOLUS FROM INFUSION: 333.0000 mL | Freq: Once | INTRAVENOUS | Status: DC

## 2024-06-03 MED ORDER — LACTATED RINGERS IV SOLN
500.0000 mL | INTRAVENOUS | Status: DC | PRN
  Administered 2024-06-03: 500 mL via INTRAVENOUS

## 2024-06-03 MED ORDER — OXYCODONE-ACETAMINOPHEN 5-325 MG PO TABS: 2.0000 | ORAL_TABLET | ORAL | Status: DC | PRN

## 2024-06-03 MED ORDER — OXYTOCIN-SODIUM CHLORIDE 30-0.9 UT/500ML-% IV SOLN
2.5000 [IU]/h | INTRAVENOUS | Status: DC
  Filled 2024-06-03: qty 500

## 2024-06-03 MED ORDER — DIPHENHYDRAMINE HCL 50 MG/ML IJ SOLN: 12.5000 mg | INTRAMUSCULAR | Status: DC | PRN

## 2024-06-03 MED ORDER — OXYCODONE-ACETAMINOPHEN 5-325 MG PO TABS: 1.0000 | ORAL_TABLET | ORAL | Status: DC | PRN

## 2024-06-03 MED ORDER — LACTATED RINGERS IV SOLN: INTRAVENOUS | Status: DC

## 2024-06-03 MED ORDER — ACETAMINOPHEN 325 MG PO TABS: 650.0000 mg | ORAL_TABLET | ORAL | Status: DC | PRN

## 2024-06-03 MED ORDER — OXYTOCIN-SODIUM CHLORIDE 30-0.9 UT/500ML-% IV SOLN
1.0000 m[IU]/min | INTRAVENOUS | Status: DC
  Administered 2024-06-03: 1 m[IU]/min via INTRAVENOUS
  Administered 2024-06-04: 05:00:00 30 [IU] via INTRAVENOUS

## 2024-06-03 MED ORDER — PHENYLEPHRINE 80 MCG/ML (10ML) SYRINGE FOR IV PUSH (FOR BLOOD PRESSURE SUPPORT)
80.0000 ug | PREFILLED_SYRINGE | INTRAVENOUS | Status: AC | PRN
  Administered 2024-06-03 (×3): 80 ug via INTRAVENOUS
  Filled 2024-06-03: qty 10

## 2024-06-03 MED ORDER — SOD CITRATE-CITRIC ACID 500-334 MG/5ML PO SOLN
30.0000 mL | ORAL | Status: DC | PRN
  Administered 2024-06-04: 04:00:00 30 mL via ORAL
  Filled 2024-06-03: qty 30

## 2024-06-03 MED ORDER — LIDOCAINE HCL (PF) 1 % IJ SOLN: 30.0000 mL | INTRAMUSCULAR | Status: DC | PRN

## 2024-06-03 MED ORDER — EPHEDRINE 5 MG/ML INJ: 10.0000 mg | INTRAVENOUS | Status: DC | PRN

## 2024-06-03 MED ORDER — FENTANYL-BUPIVACAINE-NACL 0.5-0.125-0.9 MG/250ML-% EP SOLN
12.0000 mL/h | EPIDURAL | Status: DC | PRN
  Administered 2024-06-03: 12 mL/h via EPIDURAL

## 2024-06-03 MED ORDER — FLEET ENEMA RE ENEM: 1.0000 | ENEMA | Freq: Once | RECTAL | Status: DC

## 2024-06-03 MED ORDER — FENTANYL-BUPIVACAINE-NACL 0.5-0.125-0.9 MG/250ML-% EP SOLN
EPIDURAL | Status: AC
  Filled 2024-06-03: qty 250

## 2024-06-03 MED ORDER — PHENYLEPHRINE 80 MCG/ML (10ML) SYRINGE FOR IV PUSH (FOR BLOOD PRESSURE SUPPORT): 80.0000 ug | PREFILLED_SYRINGE | INTRAVENOUS | Status: DC | PRN

## 2024-06-03 MED ORDER — LIDOCAINE-EPINEPHRINE (PF) 2 %-1:200000 IJ SOLN
INTRAMUSCULAR | Status: DC | PRN
  Administered 2024-06-03: 10:00:00 3 mL via EPIDURAL
  Administered 2024-06-04 (×2): 5 mL via EPIDURAL

## 2024-06-03 MED ORDER — LACTATED RINGERS IV SOLN
500.0000 mL | Freq: Once | INTRAVENOUS | Status: AC
  Administered 2024-06-03: 500 mL via INTRAVENOUS

## 2024-06-03 MED ORDER — LIDOCAINE HCL (PF) 1 % IJ SOLN
INTRAMUSCULAR | Status: DC | PRN
  Administered 2024-06-03: 3 mL via SUBCUTANEOUS

## 2024-06-03 MED ORDER — TERBUTALINE SULFATE 1 MG/ML IJ SOLN: 0.2500 mg | Freq: Once | INTRAMUSCULAR | Status: DC | PRN

## 2024-06-03 NOTE — Progress Notes (Signed)
 Pt seen  Labor admit at 1 cm. Contracting spontaneously. S/p epidural. Hoping for TOLAC High unengaged head. Op note from 1st C/s reviewed, C/s for CPD, chorio, extensions from wedged head delivery.   BP 120/70   Pulse 100   Temp 98.2 F (36.8 C) (Oral)   Resp 16   Ht 5' 2 (1.575 m)   Wt 74.8 kg   SpO2 96%   BMI 30.18 kg/m   FHT:  FHR: 130s bpm, variability: moderate,  accelerations:  Present,  decelerations:  Absent UC:   regular, every 3 minutes SVE:  6/ 80%.-3 per RN's recent exam.- unchanged per RN   Active abor, spontaneous contractions, high station, unchanged cervix.  Continue labor as pt hoping for TOLAC  FHT cat I  No uterine rupture concern now   IUPC dw pt, though cannot predict rupture, we can use that to see if low dose pitocin  needed. Once we add pitocin , uterine scar rupture risk increases, esp if unengaged head and concern for CPD.  MD to assess with next check  MOD Guarded   Robbi JONELLE Render 06/03/2024, 5:34 PM

## 2024-06-03 NOTE — Anesthesia Preprocedure Evaluation (Signed)
 Anesthesia Evaluation  Patient identified by MRN, date of birth, ID band Patient awake  General Assessment Comment:TOLAC  Reviewed: Allergy & Precautions, NPO status , Patient's Chart, lab work & pertinent test results  History of Anesthesia Complications Negative for: history of anesthetic complications  Airway Mallampati: II  TM Distance: >3 FB Neck ROM: Full    Dental no notable dental hx. (+) Teeth Intact   Pulmonary neg pulmonary ROS, neg sleep apnea, neg COPD, Patient abstained from smoking.Not current smoker   Pulmonary exam normal breath sounds clear to auscultation       Cardiovascular Exercise Tolerance: Good METS(-) hypertension(-) CAD and (-) Past MI negative cardio ROS (-) dysrhythmias  Rhythm:Regular Rate:Normal - Systolic murmurs    Neuro/Psych negative neurological ROS  negative psych ROS   GI/Hepatic ,neg GERD  ,,(+)     (-) substance abuse    Endo/Other  neg diabetes    Renal/GU negative Renal ROS     Musculoskeletal   Abdominal   Peds  Hematology Denies blood thinner use or bleeding disorders.    Anesthesia Other Findings Denies blood thinner use or bleeding diatheses. Recent labs reviewed. History reviewed. No pertinent past medical history.   Reproductive/Obstetrics (+) Pregnancy                              Anesthesia Physical Anesthesia Plan  ASA: 2  Anesthesia Plan: Epidural   Post-op Pain Management:    Induction:   PONV Risk Score and Plan: 2 and Treatment may vary due to age or medical condition and Ondansetron   Airway Management Planned: Natural Airway  Additional Equipment:   Intra-op Plan:   Post-operative Plan:   Informed Consent: I have reviewed the patients History and Physical, chart, labs and discussed the procedure including the risks, benefits and alternatives for the proposed anesthesia with the patient or authorized representative  who has indicated his/her understanding and acceptance.       Plan Discussed with: Surgeon  Anesthesia Plan Comments: (Discussed R/B/A of neuraxial anesthesia technique with patient: - rare risks of spinal/epidural hematoma, nerve damage, infection - Risk of PDPH - Risk of itching - Risk of nausea and vomiting - Risk of poor block necessitating replacement of epidural. - Risk of allergic reactions. Patient voiced understanding.)         Anesthesia Quick Evaluation

## 2024-06-03 NOTE — MAU Note (Signed)
 MAU Labor Triage Note:  [name] is a 28 y.o. at [redacted]w[redacted]d here in MAU reporting:  Contractions every: 5 minutes Onset of ctx: Friday with increasing frequency on Saturday Pain Score: 7  Pain Location: Abdomen  ROM: denies Vaginal Bleeding: denies Last SVE: Fingertip on Thursday Labor Pain Management Plan: Planning epidural  GBS: Negative  Fetal Movement: Reports positive FM  Vitals:   06/03/24 0237  BP: (!) 128/90  Pulse: (!) 110  Resp: 20  Temp: 98.5 F (36.9 C)  SpO2: 99%      Lab orders placed from triage: MAU Labor Eval OB Office: [facility]

## 2024-06-03 NOTE — Progress Notes (Signed)
 Pt seen  Labor admit at 1 cm. Contracting spontaneously. Painful UCs since 9 am. Epidural working well. Pt desiring TOLAC.  BP 128/84   Pulse 84   Temp 98.2 F (36.8 C) (Oral)   Resp 17   Ht 5' 2 (1.575 m)   Wt 74.8 kg   SpO2 96%   BMI 30.18 kg/m   FHT:  FHR: 130s bpm, variability: moderate,  accelerations:  Present,  decelerations:  Absent UC:   regular, every 3 minutes SVE:  6/ 80% -2.  Cx loose around head and no caput  Active abor, spontaneous contractions, unchanged cervix. No uterine rupture concern now  FHT cat I  IUPC placed after verbal consent.  Will start low dose pitocin  at 1x1 rate if MVUs <180 over next 30 min.   Increased risk of rupture dw pt and pt accepts. Closely monitor  MOD Guarded   Robbi JONELLE Render 06/03/2024, 9:22 PM

## 2024-06-03 NOTE — Anesthesia Procedure Notes (Signed)
 Epidural Patient location during procedure: OB Start time: 06/03/2024 10:00 AM End time: 06/03/2024 10:05 AM  Staffing Anesthesiologist: Boone Fess, MD Performed: anesthesiologist   Preanesthetic Checklist Completed: patient identified, IV checked, site marked, risks and benefits discussed, surgical consent, monitors and equipment checked, pre-op evaluation and timeout performed  Epidural Patient position: sitting Prep: ChloraPrep Patient monitoring: heart rate, continuous pulse ox and blood pressure Approach: midline Location: L4-L5 Injection technique: LOR saline  Needle:  Needle type: Tuohy  Needle gauge: 17 G Needle length: 9 cm Needle insertion depth: 5 cm Catheter type: closed end flexible Catheter size: 19 Gauge Catheter at skin depth: 10 cm Test dose: negative and 1.5% lidocaine  with Epi 1:200 K  Assessment Sensory level: T10 Events: blood not aspirated, no cerebrospinal fluid, injection not painful, no injection resistance, no paresthesia and negative IV test  Additional Notes First/one attempt Pt. Evaluated and documentation done after procedure finished. Patient identified. Risks/Benefits/Options discussed with patient including but not limited to bleeding, infection, nerve damage, paralysis, failed block, incomplete pain control, headache, blood pressure changes, nausea, vomiting, reactions to medication both or allergic, itching and postpartum back pain. Confirmed with bedside nurse the patient's most recent platelet count. Confirmed with patient that they are not currently taking any anticoagulation, have any bleeding history or any family history of bleeding disorders. Patient expressed understanding and wished to proceed. All questions were answered. Sterile technique was used throughout the entire procedure. Please see nursing notes for vital signs. Test dose was given through epidural catheter and negative prior to continuing to dose epidural or start infusion.  Warning signs of high block given to the patient including shortness of breath, tingling/numbness in hands, complete motor block, or any concerning symptoms with instructions to call for help. Patient was given instructions on fall risk and not to get out of bed. All questions and concerns addressed with instructions to call with any issues or inadequate analgesia.     Patient tolerated the insertion well without immediate complications.  Reason for block: procedure for painReason for block:procedure for pain

## 2024-06-03 NOTE — Progress Notes (Signed)
 S: Doing well, no complaints, pain well controlled with epidural. Was actively laboring prior to epidural placement  O: BP 134/86   Pulse (!) 114   Temp 98.2 F (36.8 C) (Oral)   Resp 16   Ht 5' 2 (1.575 m)   Wt 74.8 kg   SpO2 96%   BMI 30.18 kg/m    FHT:  FHR: 130 bpm, variability: moderate,  accelerations:  Present,  decelerations:  Absent UC:   regular, every 3-4 minutes SVE:   Dilation: 1 Effacement (%): 90 Station: -3 Exam by:: Dr. Kandyce Unclear if membranes intact, head floating, not applied to cvx  A / P:  28 y.o.  OB History  Gravida Para Term Preterm AB Living  2 1 1  0 0 1  SAB IAB Ectopic Multiple Live Births  0 0 0 0 1   at [redacted]w[redacted]d TOLAC, reactive fetal testing, slow cervical change and high fetal station poor predictors of success. R/B TOLAC d/w pt,unlikely planning another preg but not sure, high station reviewed with pt, option for c/s d/w pt, wants to cont TOLAC. Cervical foley balloon placed w/o complication.   Fetal Wellbeing:  Category I Pain Control:  Epidural  Anticipated MOD:  NSVD  Tamara Pearson 06/03/2024, 10:48 AM

## 2024-06-03 NOTE — Progress Notes (Signed)
 S: Doing well, no complaints, pain well controlled with epidural. Pt did have some low bps and dizziness with epidural, now improved after IV boluses, ephedrine  and decreasing epidural rate. Pt now noting feeling more pelvic pressure and feeling ctx upper abdomen  O: BP 120/70   Pulse 100   Temp 98.2 F (36.8 C) (Oral)   Resp 16   Ht 5' 2 (1.575 m)   Wt 74.8 kg   SpO2 96%   BMI 30.18 kg/m    FHT:  FHR: 130s bpm, variability: moderate,  accelerations:  Present,  decelerations:  Absent UC:   regular, every 3 minutes SVE:   Dilation: 6 Effacement (%): 90 Station: -3 Exam by:: Dr. Kandyce  AROM clear after full consent and discussing risks of cord prolpase,   A / P:  28 y.o.  OB History  Gravida Para Term Preterm AB Living  2 1 1  0 0 1  SAB IAB Ectopic Multiple Live Births  0 0 0 0 1   at [redacted]w[redacted]d TOLAC, good response to cervical foley and in active labor buy fetal vtx still high, suspect OP position. AROM now and will recheck in 2 hrs. D/w pt if no change will consider IUPC to assess for adequacy of ctx and plan per that info- pitocin  and c/s are options. Pt understands.   Fetal Wellbeing:  Category I Pain Control:  Epidural  Anticipated MOD:  working towards TOLAC but concerns with high fetal station.   Burnard DELENA Kandyce 06/03/2024, 4:11 PM

## 2024-06-03 NOTE — H&P (Signed)
 Tamara Pearson is a 28 y.o. G2P1001 at [redacted]w[redacted]d presenting for active labor. Pt notes  contractions q 3-5 min through the night, also notes ctx Friday and Saturday . Good fetal movement, No vaginal bleeding, not leaking fluid.  PNCare at [facility] since 28 wks -late transfer of care, good PN care prior -h/o PCS, IOL at 41+ wks with prolonged latent phase and eventual PCS after arrest of descent at 7 cm. Head wedged and needed hand from below to allow delivery due to CPD, baby 7'12. Pt desirig TOLAC and risks have been reviewed. Head has remained high station.  - Declined genetic testing -Dated by LMP c/w 7 wk u/s    Prenatal Transfer Tool  Maternal Diabetes: No Genetic Screening: Declined Maternal Ultrasounds/Referrals: Normal Fetal Ultrasounds or other Referrals:  None Maternal Substance Abuse:  No Significant Maternal Medications:  None Significant Maternal Lab Results: Group B Strep negative Number of Prenatal Visits:greater than 3 verified prenatal visits Maternal Vaccinations: declined Other Comments:  None       OB History     Gravida  2   Para  1   Term  1   Preterm      AB      Living  1      SAB      IAB      Ectopic      Multiple  0   Live Births  1          History reviewed. No pertinent past medical history. Past Surgical History:  Procedure Laterality Date   CESAREAN SECTION N/A 11/16/2020   Procedure: CESAREAN SECTION;  Surgeon: Barbette Knock, MD;  Location: [facility] LD ORS;  Service: Obstetrics;  Laterality: N/A;   Family History: family history includes Hypertension in her father. Social History:  reports that she has never smoked. She has never used smokeless tobacco. She reports that she does not currently use alcohol. She reports that she does not use drugs.  Review of Systems - Negative except painful contractions   Dilation: 1.5 Effacement (%): 70 Station: -2 Exam by:: Rosina RAMAN RN Blood pressure 136/87, pulse 97, temperature  98.2 F (36.8 C), temperature source Oral, resp. rate 16, height 5' 2 (1.575 m), weight 74.8 kg, SpO2 99%, unknown if currently breastfeeding.  Pt declines repeat cervical exam.  Prenatal labs: ABO, Rh: --/--/O POS (12/14 0515) Antibody: NEG (12/14 0515) Rubella:  immune RPR:   NR HBsAg:   neg HIV:   neg GBS:   neg 1 hr Glucola 116  Genetic screening declined Anatomy US  normal  CBC    Component Value Date/Time   WBC 15.8 (H) 06/03/2024 0519   RBC 4.11 06/03/2024 0519   HGB 12.6 06/03/2024 0519   HCT 35.7 (L) 06/03/2024 0519   PLT 191 06/03/2024 0519   MCV 86.9 06/03/2024 0519   MCH 30.7 06/03/2024 0519   MCHC 35.3 06/03/2024 0519   RDW 14.0 06/03/2024 0519    CMP     Component Value Date/Time   NA 134 (L) 06/03/2024 0517   K 4.0 06/03/2024 0517   CL 103 06/03/2024 0517   CO2 16 (L) 06/03/2024 0517   GLUCOSE 99 06/03/2024 0517   BUN 5 (L) 06/03/2024 0517   CREATININE 0.52 06/03/2024 0517   CALCIUM 9.2 06/03/2024 0517   PROT 6.5 06/03/2024 0517   ALBUMIN 3.0 (L) 06/03/2024 0517   AST 24 06/03/2024 0517   ALT 13 06/03/2024 0517   ALKPHOS 169 (H) 06/03/2024 9482  BILITOT 0.7 06/03/2024 0517   GFRNONAA >60 06/03/2024 0517     Assessment/Plan: 28 y.o. G2P1001 at [redacted]w[redacted]d Active labor, h/o PCS for CPD with wedged head and areest of descent at 7 cm. Pt aware risks TOLAC and wants to proceed. Given active labor now with SROM will allow trial but watch closely.   - mild elevated bps with normal labs  [employer] 06/03/2024, 8:23 AM  Late entry physical exam up-to-date General: Well-appearing, coping well with contractions Skin: Warm and dry : Abdomen: Gravid, nontender, no right upper quadrant pain Back: No costovertebral angle tenderness Lower extremity: Trace edema  Burnard DELENA Bowers 06/09/2024 12:42 PM

## 2024-06-03 NOTE — MAU Provider Note (Addendum)
 Event Date/Time   First Provider Initiated Contact with Patient 06/03/24 0325      S [name] is a 28 y.o. G2P1001 patient who presents to MAU today with complaint of contractions.  Nurse expresses concern with fetal tracing and blood pressures (multiple mildly elevated  bps since arrival).  Nurse requests to contact primary ob for admission, but reports patient with desire to avoid induction d/t history of c/s and desiring VBAC.   O BP (!) 129/90   Pulse (!) 103   Temp 98.5 F (36.9 C) (Oral)   Resp 20   Ht 5' 2 (1.575 m)   Wt 74.8 kg   SpO2 99%   BMI 30.18 kg/m  Physical Exam Vitals reviewed.  Constitutional:      Appearance: Normal appearance.  HENT:     Head: Normocephalic and atraumatic.  Eyes:     Conjunctiva/sclera: Conjunctivae normal.  Cardiovascular:     Rate and Rhythm: Tachycardia present.  Pulmonary:     Effort: Pulmonary effort is normal. No respiratory distress.  Musculoskeletal:        General: Normal range of motion.     Cervical back: Normal range of motion.  Neurological:     Mental Status: She is alert and oriented to person, place, and time.  Psychiatric:        Mood and Affect: Mood normal.        Behavior: Behavior normal.    Fetal Assessment -135 bpm, Mod Var, +Variable Decels, +15x15 Accels -Ctx Q2-42min  A Medical screening exam complete Elevated BPs Post Dates Cat II FT  P -Provider to bedside. -Patient states she is not opposed to admission, but would like to give her body time to progress without interventions in hopes of avoiding induction.    -Patient given support and educated on why recommendation for admission is being given: elevated bps, GA, and fetal tracing.  -Further discussion had informing patient that this provider can not speak to or for the plans that her primary provider would implement.  -Patient also states she desires an epidural, but does not want to receive it too soon. -However, patient encouraged to  advocate for herself by having a discussion with her primary ob regarding her birth desires and concerns.  -Patient verbalizes understanding and is agreeable to admission. -Nurse instructed to proceed with contacting  primary ob for admission orders.   Synthia Raisin, CNM 06/03/2024 3:25 AM

## 2024-06-04 ENCOUNTER — Encounter (HOSPITAL_COMMUNITY): Payer: Self-pay | Admitting: Obstetrics

## 2024-06-04 ENCOUNTER — Encounter (HOSPITAL_COMMUNITY): Admission: AD | Disposition: A | Payer: Self-pay | Source: Home / Self Care | Attending: Obstetrics & Gynecology

## 2024-06-04 DIAGNOSIS — Z3A40 40 weeks gestation of pregnancy: Secondary | ICD-10-CM

## 2024-06-04 DIAGNOSIS — O480 Post-term pregnancy: Secondary | ICD-10-CM

## 2024-06-04 SURGERY — Surgical Case
Anesthesia: Epidural

## 2024-06-04 MED ORDER — ACETAMINOPHEN 500 MG PO TABS
1000.0000 mg | ORAL_TABLET | Freq: Four times a day (QID) | ORAL | Status: AC
  Administered 2024-06-04 (×3): 1000 mg via ORAL
  Filled 2024-06-04 (×4): qty 2

## 2024-06-04 MED ORDER — CEFAZOLIN SODIUM-DEXTROSE 2-4 GM/100ML-% IV SOLN: 2.0000 g | INTRAVENOUS | Status: DC

## 2024-06-04 MED ORDER — MORPHINE SULFATE (PF) 0.5 MG/ML IJ SOLN
INTRAMUSCULAR | Status: DC | PRN
  Administered 2024-06-04: 06:00:00 3 mg via EPIDURAL

## 2024-06-04 MED ORDER — MENTHOL 3 MG MT LOZG: 1.0000 | LOZENGE | OROMUCOSAL | Status: DC | PRN

## 2024-06-04 MED ORDER — OXYTOCIN-SODIUM CHLORIDE 30-0.9 UT/500ML-% IV SOLN
2.5000 [IU]/h | INTRAVENOUS | Status: AC
  Administered 2024-06-04: 11:00:00 2.5 [IU]/h via INTRAVENOUS
  Filled 2024-06-04 (×2): qty 500

## 2024-06-04 MED ORDER — NALOXONE HCL 4 MG/10ML IJ SOLN: 1.0000 ug/kg/h | INTRAVENOUS | Status: DC | PRN

## 2024-06-04 MED ORDER — OXYCODONE HCL 5 MG PO TABS: 5.0000 mg | ORAL_TABLET | Freq: Once | ORAL | Status: DC | PRN

## 2024-06-04 MED ORDER — ONDANSETRON HCL 4 MG/2ML IJ SOLN: 4.0000 mg | Freq: Three times a day (TID) | INTRAMUSCULAR | Status: DC | PRN

## 2024-06-04 MED ORDER — METOCLOPRAMIDE HCL 5 MG/ML IJ SOLN
INTRAMUSCULAR | Status: AC
  Filled 2024-06-04: qty 2

## 2024-06-04 MED ORDER — KETOROLAC TROMETHAMINE 30 MG/ML IJ SOLN
INTRAMUSCULAR | Status: AC
  Filled 2024-06-04: qty 1

## 2024-06-04 MED ORDER — OXYCODONE HCL 5 MG/5ML PO SOLN: 5.0000 mg | Freq: Once | ORAL | Status: DC | PRN

## 2024-06-04 MED ORDER — NALOXONE HCL 0.4 MG/ML IJ SOLN: 0.4000 mg | INTRAMUSCULAR | Status: DC | PRN

## 2024-06-04 MED ORDER — KETOROLAC TROMETHAMINE 30 MG/ML IJ SOLN: 30.0000 mg | Freq: Four times a day (QID) | INTRAMUSCULAR | Status: AC

## 2024-06-04 MED ORDER — ACETAMINOPHEN 10 MG/ML IV SOLN
INTRAVENOUS | Status: DC | PRN
  Administered 2024-06-04: 06:00:00 1000 mg via INTRAVENOUS

## 2024-06-04 MED ORDER — KETOROLAC TROMETHAMINE 30 MG/ML IJ SOLN: 30.0000 mg | Freq: Four times a day (QID) | INTRAMUSCULAR | Status: DC

## 2024-06-04 MED ORDER — MORPHINE SULFATE (PF) 0.5 MG/ML IJ SOLN
INTRAMUSCULAR | Status: AC
  Filled 2024-06-04: qty 10

## 2024-06-04 MED ORDER — DIPHENHYDRAMINE HCL 50 MG/ML IJ SOLN: 12.5000 mg | INTRAMUSCULAR | Status: DC | PRN

## 2024-06-04 MED ORDER — ACETAMINOPHEN 325 MG PO TABS
650.0000 mg | ORAL_TABLET | ORAL | Status: DC | PRN
  Administered 2024-06-05: 07:00:00 650 mg via ORAL
  Filled 2024-06-04: qty 2

## 2024-06-04 MED ORDER — SENNOSIDES-DOCUSATE SODIUM 8.6-50 MG PO TABS
2.0000 | ORAL_TABLET | Freq: Every day | ORAL | Status: DC
  Administered 2024-06-05 – 2024-06-06 (×2): 2 via ORAL
  Filled 2024-06-04 (×2): qty 2

## 2024-06-04 MED ORDER — TRANEXAMIC ACID-NACL 1000-0.7 MG/100ML-% IV SOLN
INTRAVENOUS | Status: AC
  Filled 2024-06-04: qty 100

## 2024-06-04 MED ORDER — FENTANYL CITRATE (PF) 100 MCG/2ML IJ SOLN: 25.0000 ug | INTRAMUSCULAR | Status: DC | PRN

## 2024-06-04 MED ORDER — FENTANYL CITRATE (PF) 100 MCG/2ML IJ SOLN
INTRAMUSCULAR | Status: AC
  Filled 2024-06-04: qty 2

## 2024-06-04 MED ORDER — DIPHENHYDRAMINE HCL 25 MG PO CAPS: 25.0000 mg | ORAL_CAPSULE | Freq: Four times a day (QID) | ORAL | Status: DC | PRN

## 2024-06-04 MED ORDER — CEFAZOLIN SODIUM-DEXTROSE 2-3 GM-%(50ML) IV SOLR
INTRAVENOUS | Status: DC | PRN
  Administered 2024-06-04: 05:00:00 2 g via INTRAVENOUS

## 2024-06-04 MED ORDER — DEXAMETHASONE SODIUM PHOSPHATE 4 MG/ML IJ SOLN
INTRAMUSCULAR | Status: DC | PRN
  Administered 2024-06-04: 05:00:00 10 mg via INTRAVENOUS

## 2024-06-04 MED ORDER — METOCLOPRAMIDE HCL 5 MG/ML IJ SOLN
INTRAMUSCULAR | Status: DC | PRN
  Administered 2024-06-04: 05:00:00 10 mg via INTRAVENOUS

## 2024-06-04 MED ORDER — OXYTOCIN-SODIUM CHLORIDE 30-0.9 UT/500ML-% IV SOLN
INTRAVENOUS | Status: AC
  Filled 2024-06-04: qty 500

## 2024-06-04 MED ORDER — LACTATED RINGERS IV SOLN: INTRAVENOUS | Status: DC | PRN

## 2024-06-04 MED ORDER — DIPHENHYDRAMINE HCL 25 MG PO CAPS: 25.0000 mg | ORAL_CAPSULE | ORAL | Status: DC | PRN

## 2024-06-04 MED ORDER — ACETAMINOPHEN 10 MG/ML IV SOLN
INTRAVENOUS | Status: AC
  Filled 2024-06-04: qty 100

## 2024-06-04 MED ORDER — SODIUM CHLORIDE 0.9 % IV SOLN
500.0000 mg | INTRAVENOUS | Status: AC
  Administered 2024-06-04: 05:00:00 500 mg via INTRAVENOUS
  Filled 2024-06-04: qty 5

## 2024-06-04 MED ORDER — PRENATAL MULTIVITAMIN CH
1.0000 | ORAL_TABLET | Freq: Every day | ORAL | Status: DC
  Administered 2024-06-04 – 2024-06-06 (×3): 1 via ORAL
  Filled 2024-06-04 (×3): qty 1

## 2024-06-04 MED ORDER — IBUPROFEN 600 MG PO TABS: 600.0000 mg | ORAL_TABLET | Freq: Four times a day (QID) | ORAL | Status: DC

## 2024-06-04 MED ORDER — WITCH HAZEL-GLYCERIN EX PADS: 1.0000 | MEDICATED_PAD | CUTANEOUS | Status: DC | PRN

## 2024-06-04 MED ORDER — POVIDONE-IODINE 10 % EX SWAB: 2.0000 | Freq: Once | CUTANEOUS | Status: DC

## 2024-06-04 MED ORDER — ZOLPIDEM TARTRATE 5 MG PO TABS: 5.0000 mg | ORAL_TABLET | Freq: Every evening | ORAL | Status: DC | PRN

## 2024-06-04 MED ORDER — ONDANSETRON HCL 4 MG/2ML IJ SOLN
INTRAMUSCULAR | Status: AC
  Filled 2024-06-04: qty 2

## 2024-06-04 MED ORDER — FENTANYL CITRATE (PF) 100 MCG/2ML IJ SOLN
INTRAMUSCULAR | Status: DC | PRN
  Administered 2024-06-04: 05:00:00 100 ug via EPIDURAL

## 2024-06-04 MED ORDER — DIBUCAINE (PERIANAL) 1 % EX OINT: 1.0000 | TOPICAL_OINTMENT | CUTANEOUS | Status: DC | PRN

## 2024-06-04 MED ORDER — SIMETHICONE 80 MG PO CHEW: 80.0000 mg | CHEWABLE_TABLET | ORAL | Status: DC | PRN

## 2024-06-04 MED ORDER — SCOPOLAMINE 1 MG/3DAYS TD PT72
MEDICATED_PATCH | TRANSDERMAL | Status: DC | PRN
  Administered 2024-06-04: 05:00:00 1 via TRANSDERMAL

## 2024-06-04 MED ORDER — SODIUM CHLORIDE 0.9 % IV SOLN
INTRAVENOUS | Status: AC
  Filled 2024-06-04: qty 5

## 2024-06-04 MED ORDER — OXYCODONE HCL 5 MG PO TABS: 5.0000 mg | ORAL_TABLET | ORAL | Status: DC | PRN

## 2024-06-04 MED ORDER — DROPERIDOL 2.5 MG/ML IJ SOLN: 0.6250 mg | Freq: Once | INTRAMUSCULAR | Status: DC | PRN

## 2024-06-04 MED ORDER — METHYLERGONOVINE MALEATE 0.2 MG/ML IJ SOLN
INTRAMUSCULAR | Status: DC | PRN
  Administered 2024-06-04: 05:00:00 .2 mg via INTRAMUSCULAR

## 2024-06-04 MED ORDER — TRANEXAMIC ACID-NACL 1000-0.7 MG/100ML-% IV SOLN
INTRAVENOUS | Status: DC | PRN
  Administered 2024-06-04: 05:00:00 1000 mg via INTRAVENOUS

## 2024-06-04 MED ORDER — COCONUT OIL OIL: 1.0000 | TOPICAL_OIL | Status: DC | PRN

## 2024-06-04 MED ORDER — SIMETHICONE 80 MG PO CHEW
80.0000 mg | CHEWABLE_TABLET | Freq: Three times a day (TID) | ORAL | Status: DC
  Administered 2024-06-04 – 2024-06-06 (×6): 80 mg via ORAL
  Filled 2024-06-04 (×7): qty 1

## 2024-06-04 MED ORDER — KETOROLAC TROMETHAMINE 30 MG/ML IJ SOLN
30.0000 mg | Freq: Four times a day (QID) | INTRAMUSCULAR | Status: AC
  Administered 2024-06-04 (×4): 30 mg via INTRAVENOUS
  Filled 2024-06-04 (×3): qty 1

## 2024-06-04 MED ORDER — SODIUM CHLORIDE 0.9% FLUSH: 3.0000 mL | INTRAVENOUS | Status: DC | PRN

## 2024-06-04 SURGICAL SUPPLY — 33 items
BARRIER ADHS 3X4 INTERCEED (GAUZE/BANDAGES/DRESSINGS) IMPLANT
BENZOIN TINCTURE PRP APPL 2/3 (GAUZE/BANDAGES/DRESSINGS) IMPLANT
CHLORAPREP W/TINT 26 (MISCELLANEOUS) ×2 IMPLANT
CLAMP UMBILICAL CORD (MISCELLANEOUS) ×1 IMPLANT
CLOTH BEACON ORANGE TIMEOUT ST (SAFETY) ×1 IMPLANT
CLSR STERI-STRIP ANTIMIC 1/2X4 (GAUZE/BANDAGES/DRESSINGS) IMPLANT
DISSECTOR SURG LIGASURE 21 (MISCELLANEOUS) ×1 IMPLANT
DRSG OPSITE POSTOP 4X10 (GAUZE/BANDAGES/DRESSINGS) ×1 IMPLANT
ELECTRODE REM PT RTRN 9FT ADLT (ELECTROSURGICAL) ×1 IMPLANT
EXTRACTOR VACUUM KIWI (MISCELLANEOUS) IMPLANT
EXTRACTOR VACUUM M CUP 4 TUBE (SUCTIONS) IMPLANT
GAUZE PAD ABD 7.5X8 STRL (GAUZE/BANDAGES/DRESSINGS) IMPLANT
GAUZE SPONGE 4X4 12PLY STRL LF (GAUZE/BANDAGES/DRESSINGS) IMPLANT
GLOVE BIO SURGEON STRL SZ7 (GLOVE) ×1 IMPLANT
GLOVE BIOGEL PI IND STRL 7.0 (GLOVE) ×1 IMPLANT
GOWN STRL REUS W/TWL LRG LVL3 (GOWN DISPOSABLE) ×2 IMPLANT
KIT ABG SYR 3ML LUER SLIP (SYRINGE) IMPLANT
NDL HYPO 25X5/8 SAFETYGLIDE (NEEDLE) IMPLANT
NEEDLE HYPO 25X5/8 SAFETYGLIDE (NEEDLE) IMPLANT
NS IRRIG 1000ML POUR BTL (IV SOLUTION) ×1 IMPLANT
PACK C SECTION WH (CUSTOM PROCEDURE TRAY) ×1 IMPLANT
PAD OB MATERNITY 4.3X12.25 (PERSONAL CARE ITEMS) ×1 IMPLANT
RTRCTR C-SECT PINK 25CM LRG (MISCELLANEOUS) IMPLANT
STRIP CLOSURE SKIN 1/2X4 (GAUZE/BANDAGES/DRESSINGS) IMPLANT
SUT MNCRL 0 VIOLET CTX 36 (SUTURE) ×2 IMPLANT
SUT PLAIN ABS 2-0 CT1 27XMFL (SUTURE) IMPLANT
SUT VIC AB 0 CT1 27XBRD ANBCTR (SUTURE) ×2 IMPLANT
SUT VIC AB 2-0 CT1 TAPERPNT 27 (SUTURE) ×1 IMPLANT
SUT VIC AB 2-0 SH 27XBRD (SUTURE) ×2 IMPLANT
SUT VIC AB 4-0 KS 27 (SUTURE) ×1 IMPLANT
TOWEL OR 17X24 6PK STRL BLUE (TOWEL DISPOSABLE) ×1 IMPLANT
TRAY FOLEY W/BAG SLVR 14FR LF (SET/KITS/TRAYS/PACK) IMPLANT
WATER STERILE IRR 1000ML POUR (IV SOLUTION) ×1 IMPLANT

## 2024-06-04 NOTE — Transfer of Care (Signed)
 Immediate Anesthesia Transfer of Care Note  Patient: Tamara Pearson  Procedure(s) Performed: CESAREAN DELIVERY  Patient Location: PACU  Anesthesia Type:Epidural  Level of Consciousness: awake, alert , oriented, and patient cooperative  Airway & Oxygen Therapy: Patient Spontanous Breathing  Post-op Assessment: Report given to RN and Post -op Vital signs reviewed and stable  Post vital signs: Reviewed and stable  Last Vitals:  Vitals Value Taken Time  BP 117/81 06/04/24 06:30  Temp 37.2 C 06/04/24 06:17  Pulse 88 06/04/24 06:31  Resp 17 06/04/24 06:31  SpO2 94 % 06/04/24 06:31  Vitals shown include unfiled device data.  Last Pain:  Vitals:   06/04/24 0617  TempSrc:   PainSc: 0-No pain         Complications: No notable events documented.

## 2024-06-04 NOTE — Progress Notes (Signed)
 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 Pt informed RN that she is not allergic to soy and that she eats soy frequently. She requested to have it changed in order for her to be able to order from the cafeteria. RN updated allergy list.   Cayleb Jarnigan, RN 06/04/2024 10:31 AM

## 2024-06-04 NOTE — Lactation Note (Signed)
 This note was copied from a baby's chart. Lactation Consultation Note  Patient Name: Tamara Pearson Date: 06/04/2024 Age:30 hours Reason for consult: Initial assessment;Term  P2- MOB reports that infant has been nursing well right from delivery. Infant was actively nursing on the breast when LC entered the room. Multiple swallows noted during the consult. MOB reports having an oversupply with her first child. MOB denies having any mastitis, but states that she would get clogged ducts often. MOB denies having any questions or concerns at this time.  LC reviewed the first 24 hr birthday nap, day 2 cluster feeding, feeding infant on cue 8-12x in 24 hrs, not allowing infant to go over 3 hrs without a feeding, CDC milk storage guidelines, and LC services handout. LC encouraged MOB to call for further assistance as needed.  Maternal Data Has patient been taught Hand Expression?: No Does the patient have breastfeeding experience prior to this delivery?: Yes How long did the patient breastfeed?: 1 year of breast only and then pumped for an additional 2 months  Feeding Mother's Current Feeding Choice: Breast Milk  LATCH Score Latch: Grasps breast easily, tongue down, lips flanged, rhythmical sucking.  Audible Swallowing: A few with stimulation  Type of Nipple: Everted at rest and after stimulation  Comfort (Breast/Nipple): Soft / non-tender  Hold (Positioning): No assistance needed to correctly position infant at breast.  LATCH Score: 9   Lactation Tools Discussed/Used Pump Education: Milk Storage  Interventions Interventions: Breast feeding basics reviewed;Education;LC Services brochure  Discharge Discharge Education: Engorgement and breast care;Warning signs for feeding baby Pump: DEBP;Hands Free;Personal  Consult Status Consult Status: Follow-up Date: 06/05/24 Follow-up type: In-patient    Recardo Hoit BS, IBCLC 06/04/2024, 8:36 PM

## 2024-06-04 NOTE — Progress Notes (Signed)
 Pt seen.  Pitocin  at 12 mu, UCs adequate  FHT cat I  Exam unchanged  6/80/-2   Uterine contraction pain in LUQ is new. Urine dark but not bloody No increased vag bleeding or loss of station  Arrest of dilation 12 hours.  Recc Repeat C/section.. Pt now accepts this plan  Risks/complications of surgery reviewed incl infection, bleeding, damage to internal organs including bladder, bowels, ureters, blood vessels, other risks from anesthesia, VTE and delayed complications of any surgery, complications in future surgery reviewed. Also discussed neonatal complications incl difficult delivery, laceration, vacuum assistance, TTN etc. Pt understands and agrees, all concerns addressed.    Tamara Pearson 06/04/2024, 4:15 AM

## 2024-06-04 NOTE — Op Note (Addendum)
 Cesarean Section Procedure Note   Tamara Pearson  06/04/2024  Procedure: Repeat Low transverse cesarean section   Indications: Arrest of dilation, failed TOLAC    Pre-operative Diagnosis: arrest of dilation. 40.6 weeks, prior one c-section, failed TOLAC  Post-operative Diagnosis: Same   Surgeon: Surgeons and Role:    DEWAINE Barbette Knock, MD - Primary   Assistants: none  Anesthesia: epidural   Procedure Details:  The patient was seen in the Labor Room. She was at 6 c, dilation for over 12 hours.  The risks, benefits, complications, treatment options, and expected outcomes were discussed with the patient. The patient concurred with the proposed plan, giving informed consent. identified as Tamara Pearson and the procedure verified as Repeat C-Section Delivery. A Time Out was held and the above information confirmed.  2 gm Ancef , 500 mg Azithromycin  and 1000 mg TXA given.  After induction of anesthesia, the patient was draped and prepped in the usual sterile manner, foley was draining urine well.  A pfannenstiel incision was made and carried down through the subcutaneous tissue to the fascia. Fascial incision was made and extended transversely. The fascia was separated from the underlying rectus tissue superiorly and inferiorly. The peritoneum was identified and entered ar superior aspect as extensive bladder swelling and high placement noted.  Peritoneal incision was extended longitudinally. Utero-vesical peritoneal dissection needed to be done to separate bladder down to get to lower segment. Alexis-O retractor placed. RN Charmaine gave hand assistance per vagina to elevate fetal head.  A low transverse uterine incision was made. Baby delivered from mid-pelvic cephalic. Female infant delivered with vigorous cry. Apgar scores of 8 at one minute and 9 at five minutes. While assistant held the baby, I assessed hysterotomy and bilateral lateral angle extensions going to cervico-vaginal junction noted.  Rings applied to control some bleeding. Delayed cord clamping done at 1 minute and baby handed to NICU team in attendance. Cord ph was not sent. Cord blood was obtained for evaluation. The placenta was removed Intact and appeared normal. Uterus was exteriorized. The uterine outline noted bilateral extensions from lateral angles down to cervicovaginal junction. Bilateral tubes and ovaries appeared normal. The uterine incision was closed with running locked sutures of in single layer securing angles with extensions well and then closing entirely.   Hemostasis was observed. Uterus returned to abdomen. Hemostasis assessed and was excellent. Alexis retractor removed. Peritoneal closure done with 2-0 Vicryl.  The fascia was then reapproximated with running sutures of 0Vicryl. The subcuticular closure was performed using 2-0plain gut. The skin was closed with 4-0Vicryl. Steristrips, honeycomb and pressure dressing placed.  Instrument, sponge, and needle counts were correct prior the abdominal closure and were correct at the conclusion of the case.   Findings: Bladder adherent to mid uterine body with severe swelling of bladder and lower segment.  There was no evidence of scar dehiscence but lower segment was thin. Bilateral lateral extensions to cervicovaginal junction noted and repaired without bladder injury    Estimated Blood Loss: 700 cc  Total IV Fluids: 1000 ml LR   Urine Output: 100 CC OF clear urine  Specimens: cord blood. Placenta    Complications: no complications  Disposition: PACU - hemodynamically stable.   Maternal Condition: stable   Baby condition / location:  Couplet care / Skin to Skin  Attending Attestation: I performed the procedure.   Signed: Surgeon(s): Barbette Knock, MD

## 2024-06-04 NOTE — Progress Notes (Addendum)
 Pt seen. TOLAC plan.  FHT cat I  Exam unchanged MVUs not adequate, pitocin  at 6 mu  Pt reported sharp pain earlier and RN gave epidural bolus, feels okay now.   Active labor with arrest of dilation.  6 cm since 3.30 pm. Indeed foley 6 cm and now adequate.  Recommend C/section., pt declines, wants to try longer since everything is ok now.  Advised arrest of dilation is not okay but considering desire to pursue, plan to increase pitocin  2x2 and reassess in 2 hrs.   MOD Guarded   Robbi JONELLE Render 06/04/2024, 1:33 AM

## 2024-06-05 LAB — CBC
HCT: 25.5 % — ABNORMAL LOW (ref 36.0–46.0)
Hemoglobin: 8.5 g/dL — ABNORMAL LOW (ref 12.0–15.0)
MCH: 30.7 pg (ref 26.0–34.0)
MCHC: 33.3 g/dL (ref 30.0–36.0)
MCV: 92.1 fL (ref 80.0–100.0)
Platelets: 152 K/uL (ref 150–400)
RBC: 2.77 MIL/uL — ABNORMAL LOW (ref 3.87–5.11)
RDW: 14.6 % (ref 11.5–15.5)
WBC: 17.9 K/uL — ABNORMAL HIGH (ref 4.0–10.5)
nRBC: 0 % (ref 0.0–0.2)

## 2024-06-05 LAB — SURGICAL PATHOLOGY

## 2024-06-05 MED ORDER — IBUPROFEN 600 MG PO TABS
600.0000 mg | ORAL_TABLET | Freq: Four times a day (QID) | ORAL | Status: DC
  Administered 2024-06-05 – 2024-06-06 (×5): 600 mg via ORAL
  Filled 2024-06-05 (×5): qty 1

## 2024-06-05 MED ORDER — ACETAMINOPHEN 500 MG PO TABS
1000.0000 mg | ORAL_TABLET | Freq: Four times a day (QID) | ORAL | Status: DC
  Administered 2024-06-05 – 2024-06-06 (×4): 1000 mg via ORAL
  Filled 2024-06-05 (×5): qty 2

## 2024-06-05 NOTE — Progress Notes (Signed)
 Subjective: Postpartum Day 1: Cesarean Delivery Patient reports tolerating PO, + flatus, and no problems voiding.    Objective: Vital signs in last 24 hours: Temp:  [97.6 F (36.4 C)-98.5 F (36.9 C)] 97.6 F (36.4 C) (12/16 0509) Pulse Rate:  [56-65] 56 (12/16 0509) Resp:  [18] 18 (12/16 0509) BP: (96-120)/(55-78) 96/64 (12/16 0509) SpO2:  [97 %-98 %] 97 % (12/16 0509)  Physical Exam:  General: alert, cooperative, and no distress Lochia: appropriate Uterine Fundus: firm Incision: no significant drainage, Honeycomb dressing intact DVT Evaluation: No evidence of DVT seen on physical exam. Negative Homan's sign. No cords or calf tenderness. No significant calf/ankle edema.  Recent Labs    06/03/24 0519 06/05/24 0418  HGB 12.6 8.5*  HCT 35.7* 25.5*    Assessment/Plan: Status post Cesarean section. Doing well postoperatively.  Continue current care.  Delila CHRISTELLA Fischer, DO 06/05/2024, 10:06 AM

## 2024-06-05 NOTE — Anesthesia Postprocedure Evaluation (Signed)
 Anesthesia Post Note  Patient: Tamara Pearson  Procedure(s) Performed: CESAREAN DELIVERY     Patient location during evaluation: PACU Anesthesia Type: Epidural Level of consciousness: oriented and awake and alert Pain management: pain level controlled Vital Signs Assessment: post-procedure vital signs reviewed and stable Respiratory status: spontaneous breathing, respiratory function stable and patient connected to nasal cannula oxygen Cardiovascular status: blood pressure returned to baseline and stable Postop Assessment: no headache, no backache and no apparent nausea or vomiting Anesthetic complications: no   No notable events documented.  Last Vitals:  Vitals:   06/04/24 2224 06/05/24 0509  BP: (!) 102/55 96/64  Pulse:  (!) 56  Resp: 18 18  Temp: 36.6 C 36.4 C  SpO2: 97% 97%    Last Pain:  Vitals:   06/05/24 0703  TempSrc:   PainSc: 4                  Rome Ade

## 2024-06-05 NOTE — Lactation Note (Signed)
 This note was copied from a baby's chart. Lactation Consultation Note  Patient Name: Tamara Pearson Date: 06/05/2024 Age:30 hours Reason for consult: Term;Follow-up assessment (infant had weight loss -5.70%).  P2, Per MOB, infant is breastfeeding well and most feedings are 25 to 30 minutes in length. MOB is knowledgeable regarding breastfeeding, she breastfeed her 1st child past one year. MOB does not have any breastfeeding concerns for LC and would like to be placed PRN. MOB knows to call [facility] services if she has breastfeeding questions, concerns or need latch assistance. LC discussed importance of maternal meals, hydration and rest. LC did not observe latch due infant recently BF one hour ago for 30 minutes.   Maternal Data    Feeding Mother's Current Feeding Choice: Breast Milk  LATCH Score                    Lactation Tools Discussed/Used    Interventions Interventions: Skin to skin;Position options;Education  Discharge    Consult Status Consult Status: Follow-up Date: 06/06/24 Follow-up type: In-patient    [name] 06/05/2024, 5:24 PM

## 2024-06-06 MED ORDER — SIMETHICONE 80 MG PO CHEW: 80.0000 mg | CHEWABLE_TABLET | ORAL | 0 refills | Status: AC | PRN

## 2024-06-06 MED ORDER — SIMETHICONE 80 MG PO CHEW: 80.0000 mg | CHEWABLE_TABLET | ORAL | 0 refills | Status: DC | PRN

## 2024-06-06 MED ORDER — IBUPROFEN 600 MG PO TABS: 600.0000 mg | ORAL_TABLET | Freq: Four times a day (QID) | ORAL | 0 refills | Status: AC

## 2024-06-06 MED ORDER — ACETAMINOPHEN 325 MG PO TABS: 650.0000 mg | ORAL_TABLET | ORAL | Status: AC | PRN

## 2024-06-06 NOTE — Progress Notes (Signed)
 Subjective: Postpartum Day 2: Cesarean Delivery Patient reports tolerating PO, + flatus, + BM, and no problems voiding.    Objective: Vital signs in last 24 hours: Temp:  [97.7 F (36.5 C)-98.4 F (36.9 C)] 97.7 F (36.5 C) (12/17 0534) Pulse Rate:  [70-86] 70 (12/17 0534) Resp:  [18] 18 (12/17 0534) BP: (108-113)/(67-76) 110/70 (12/17 0534) SpO2:  [97 %-100 %] 97 % (12/16 2047)  Physical Exam:  General: alert, cooperative, and no distress Lochia: appropriate Uterine Fundus: firm Incision: no significant drainage, no significant erythema, Honeycomb dressing intact DVT Evaluation: No evidence of DVT seen on physical exam. Negative Homan's sign. No cords or calf tenderness. No significant calf/ankle edema.  Recent Labs    06/05/24 0418  HGB 8.5*  HCT 25.5*    Assessment/Plan: Status post Cesarean section. Doing well postoperatively.  Discharge home with standard precautions and return to clinic in 4-6 weeks.  Delila CHRISTELLA Fischer, DO 06/06/2024, 10:26 AM

## 2024-06-06 NOTE — Discharge Summary (Signed)
 Postpartum Discharge Summary  Date of Service updated: 06/06/2024     Patient Name: Tamara Pearson DOB: 10-01-1995 MRN: [id number]  Date of admission: 06/03/2024 Delivery date:06/04/2024 Delivering provider: MODY, VAISHALI Date of discharge: 06/06/2024  Admitting diagnosis: Normal labor [O80, Z37.9] Delivery by emergency cesarean [O99.892] Intrauterine pregnancy: [redacted]w[redacted]d     Secondary diagnosis:  Principal Problem:   Normal labor Active Problems:   Delivery by emergency cesarean     Discharge diagnosis: Term Pregnancy Delivered and Failed TOLAC                                              Post partum procedures:none Augmentation: AROM and Pitocin  Complications: None  Hospital course: Onset of Labor With Unplanned C/S   28 y.o. yo [id number] at [redacted]w[redacted]d was admitted in Active Labor on 06/03/2024. Patient had a labor course significant for arrest of dilation. The patient went for cesarean section due to Arrest of Dilation. Delivery details as follows: Membrane Rupture Time/Date: 4:03 PM,06/03/2024  Delivery Method:C-Section, Low Transverse Operative Delivery:N/A Details of operation can be found in separate operative note. Patient had a postpartum course that was uncomplicated.  She is ambulating,tolerating a regular diet, passing flatus, and urinating well.  Patient is discharged home in stable condition 06/06/2024.  Newborn Data: Birth date:06/04/2024 Birth time:5:18 AM Gender:Female Living status:Living Apgars:8 ,9  Weight:3950 g  Magnesium Sulfate received: No BMZ received: No Rhophylac:N/A MMR:N/A T-DaP: Flu: No RSV Vaccine received: No Transfusion:No Immunizations administered: There is no immunization history for the selected administration types on file for this patient.  Physical exam  Vitals:   06/05/24 0509 06/05/24 1502 06/05/24 2047 06/06/24 0534  BP: 96/64 108/76 113/67 110/70  Pulse: (!) 56 86 70 70  Resp: 18 18 18 18   Temp: 97.6 F (36.4 C)  98.4 F  (36.9 C) 97.7 F (36.5 C)  TempSrc: Oral  Oral Oral  SpO2: 97% 100% 97%   Weight:      Height:       General: alert, cooperative, and no distress Lochia: appropriate Uterine Fundus: firm Incision: Honeycomb Dressing is clean, dry, and intact DVT Evaluation: No evidence of DVT seen on physical exam. Negative Homan's sign. No cords or calf tenderness. No significant calf/ankle edema. Labs: Lab Results  Component Value Date   WBC 17.9 (H) 06/05/2024   HGB 8.5 (L) 06/05/2024   HCT 25.5 (L) 06/05/2024   MCV 92.1 06/05/2024   PLT 152 06/05/2024      Latest Ref Rng & Units 06/03/2024    5:17 AM  CMP  Glucose 70 - 99 mg/dL 99   BUN 6 - 20 mg/dL 5   Creatinine 9.55 - 8.99 mg/dL 9.47   Sodium 864 - 854 mmol/L 134   Potassium 3.5 - 5.1 mmol/L 4.0   Chloride 98 - 111 mmol/L 103   CO2 22 - 32 mmol/L 16   Calcium 8.9 - 10.3 mg/dL 9.2   Total Protein 6.5 - 8.1 g/dL 6.5   Total Bilirubin 0.0 - 1.2 mg/dL 0.7   Alkaline Phos 38 - 126 U/L 169   AST 15 - 41 U/L 24   ALT 0 - 44 U/L 13    Edinburgh Score:    06/05/2024    8:12 PM  Edinburgh Postnatal Depression Scale Screening Tool  I have been able to laugh and see the funny  side of things. 0  I have looked forward with enjoyment to things. 0  I have blamed myself unnecessarily when things went wrong. 0  I have been anxious or worried for no good reason. 0  I have felt scared or panicky for no good reason. 0  Things have been getting on top of me. 0  I have been so unhappy that I have had difficulty sleeping. 0  I have felt sad or miserable. 0  I have been so unhappy that I have been crying. 0  The thought of harming myself has occurred to me. 0  Edinburgh Postnatal Depression Scale Total 0     After visit meds:  Allergies as of 06/06/2024   No Active Allergies      Medication List     TAKE these medications    acetaminophen  325 MG tablet Commonly known as: TYLENOL  Take 2 tablets (650 mg total) by mouth every 4  (four) hours as needed for mild pain (pain score 1-3) or fever (temperature > 101.5.).   ibuprofen  600 MG tablet Commonly known as: ADVIL  Take 1 tablet (600 mg total) by mouth every 6 (six) hours.   oxyCODONE  5 MG immediate release tablet Commonly known as: Oxy IR/ROXICODONE  Take 1-2 tablets (5-10 mg total) by mouth every 4 (four) hours as needed for moderate pain (pain score 4-6).   simethicone  80 MG chewable tablet Commonly known as: MYLICON Chew 1 tablet (80 mg total) by mouth as needed for flatulence.        Discharge home in stable condition Infant Feeding: Breast Infant Disposition:home with mother Discharge instruction: per After Visit Summary and Postpartum booklet. Activity: Advance as tolerated. Pelvic rest for 6 weeks.  Diet: routine diet Anticipated Birth Control: Unsure Postpartum Appointment:1 week Additional Postpartum F/U: Incision check 1 week Future Appointments:No future appointments. Follow up Visit:   06/06/2024 Delila CHRISTELLA Fischer, DO

## 2024-06-12 ENCOUNTER — Telehealth (HOSPITAL_COMMUNITY): Payer: Self-pay | Admitting: *Deleted

## 2024-06-12 NOTE — Telephone Encounter (Signed)
 06/12/2024  Name: Saren Corkern MRN: [id number] DOB: 08/15/1995  Reason for Call:  Transition of Care Hospital Discharge Call  Contact Status: Patient Contact Status: Complete  Language assistant needed:          Follow-Up Questions: Do You Have Any Concerns About Your Health As You Heal From Delivery?: No Do You Have Any Concerns About Your Infants Health?: No  Edinburgh Postnatal Depression Scale:  In the Past 7 Days:   Patient reported that her answers are the same as when she completed the EPDS in the hospital on 06/05/24. Score at that time was 0. Stated that she is doing well and has good support. EPDS not completed at this time PHQ2-9 Depression Scale:     Discharge Follow-up: Edinburgh score requires follow up?: N/A Patient was advised of the following resources:: Breastfeeding Support Group, Support Group Declined email information at this time. Post-discharge interventions: Reviewed Newborn Safe Sleep Practices  Signature Allean IVAR Carton, RN, 06/12/24, [phone]
